# Patient Record
Sex: Female | Born: 1980 | Race: White | Hispanic: No | Marital: Married | Smoking: Never smoker
Health system: Southern US, Community
[De-identification: ages and names within clinical notes are randomized; demographics above are authoritative.]

## PROBLEM LIST (undated history)

## (undated) DIAGNOSIS — O24419 Gestational diabetes mellitus in pregnancy, unspecified control: Secondary | ICD-10-CM

## (undated) DIAGNOSIS — R51 Headache: Secondary | ICD-10-CM

## (undated) DIAGNOSIS — Z8619 Personal history of other infectious and parasitic diseases: Secondary | ICD-10-CM

## (undated) DIAGNOSIS — Z87442 Personal history of urinary calculi: Secondary | ICD-10-CM

## (undated) HISTORY — DX: Personal history of other infectious and parasitic diseases: Z86.19

## (undated) HISTORY — DX: Headache: R51

## (undated) HISTORY — PX: WISDOM TOOTH EXTRACTION: SHX21

## (undated) HISTORY — DX: Personal history of urinary calculi: Z87.442

---

## 2010-11-22 LAB — OB RESULTS CONSOLE GBS: GBS: NEGATIVE

## 2011-05-09 LAB — OB RESULTS CONSOLE HIV ANTIBODY (ROUTINE TESTING): HIV: NONREACTIVE

## 2011-05-09 LAB — OB RESULTS CONSOLE ANTIBODY SCREEN: Antibody Screen: NEGATIVE

## 2011-05-09 LAB — OB RESULTS CONSOLE ABO/RH: RH Type: POSITIVE

## 2011-05-23 LAB — OB RESULTS CONSOLE GC/CHLAMYDIA
Chlamydia: NEGATIVE
Gonorrhea: NEGATIVE

## 2011-08-23 ENCOUNTER — Encounter (HOSPITAL_COMMUNITY): Payer: Self-pay | Admitting: *Deleted

## 2011-08-23 ENCOUNTER — Emergency Department (HOSPITAL_COMMUNITY)
Admission: EM | Admit: 2011-08-23 | Discharge: 2011-08-24 | Disposition: A | Discharge status: Home / Self Care | Payer: No Typology Code available for payment source | Attending: Emergency Medicine | Admitting: Emergency Medicine

## 2011-08-23 DIAGNOSIS — Z3400 Encounter for supervision of normal first pregnancy, unspecified trimester: Secondary | ICD-10-CM | POA: Insufficient documentation

## 2011-08-23 DIAGNOSIS — R109 Unspecified abdominal pain: Secondary | ICD-10-CM | POA: Insufficient documentation

## 2011-08-23 DIAGNOSIS — T1490XA Injury, unspecified, initial encounter: Secondary | ICD-10-CM | POA: Insufficient documentation

## 2011-08-23 DIAGNOSIS — M25519 Pain in unspecified shoulder: Secondary | ICD-10-CM | POA: Insufficient documentation
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
## 2011-08-24 ENCOUNTER — Emergency Department (HOSPITAL_COMMUNITY): Payer: No Typology Code available for payment source

## 2011-08-24 LAB — KLEIHAUER-BETKE STAIN
Fetal Cells %: 0 %
Quantitation Fetal Hemoglobin: 0 mL

## 2011-08-24 NOTE — ED Notes (Signed)
Pt returned from US. No signs of distress.
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
## 2011-08-24 NOTE — ED Notes (Signed)
PT ambulated with a  Steady gait; VSS; A&Ox3; no sigsn of distress; respirations even and unlabored; skin warm and dry; no questions at this time.
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
## 2011-08-24 NOTE — ED Notes (Signed)
Tenderness on left side of where neck meets shoulder.

## 2011-08-24 NOTE — ED Notes (Signed)
Patient transported to Ultrasound 

## 2011-08-25 LAB — ABO/RH: ABO/RH(D): O POS

## 2011-10-16 ENCOUNTER — Encounter: Payer: BC Managed Care – PPO | Attending: Obstetrics and Gynecology | Admitting: Dietician

## 2011-10-16 DIAGNOSIS — Z713 Dietary counseling and surveillance: Secondary | ICD-10-CM | POA: Insufficient documentation

## 2011-10-16 DIAGNOSIS — O99810 Abnormal glucose complicating pregnancy: Secondary | ICD-10-CM | POA: Insufficient documentation

## 2011-10-17 ENCOUNTER — Encounter: Payer: Self-pay | Admitting: Dietician

## 2011-10-17 NOTE — Progress Notes (Signed)
  Patient was seen on 10/16/2011 for Gestational Diabetes self-management class at the [facility]. The following learning objectives were met by the patient during this course:   States the definition of Gestational Diabetes  States why dietary management is important in controlling blood glucose  Describes the effects each nutrient has on blood glucose levels  Demonstrates ability to create a balanced meal plan  Demonstrates carbohydrate counting   States when to check blood glucose levels  Demonstrates proper blood glucose monitoring techniques  States the effect of stress and exercise on blood glucose levels  States the importance of limiting caffeine and abstaining from alcohol and smoking  Blood glucose monitor given: Accu-Chek SmartView Meter Kit (Accu-Chek Smartview strips and Accu-Chek Fastclix lancets) Lot # [id number] Exp: 02/09/2013 Blood glucose reading: 95 at 10:30 AM  Patient instructed to monitor glucose levels:Fasting and 2 hours after the first bite of each meal FBS: 60 - <90 2 hour: <120  Patient received handouts:  Nutrition Diabetes and Pregnancy  Carbohydrate Counting List  Patient will be seen for follow-up as needed.

## 2011-12-18 ENCOUNTER — Inpatient Hospital Stay (HOSPITAL_COMMUNITY): Admission: AD | Admit: 2011-12-18 | Payer: Self-pay | Source: Ambulatory Visit | Admitting: Obstetrics and Gynecology

## 2011-12-19 ENCOUNTER — Encounter (HOSPITAL_COMMUNITY): Payer: Self-pay | Admitting: *Deleted

## 2011-12-19 ENCOUNTER — Telehealth (HOSPITAL_COMMUNITY): Payer: Self-pay | Admitting: *Deleted

## 2011-12-19 NOTE — Telephone Encounter (Signed)
Preadmission screen  

## 2011-12-23 ENCOUNTER — Encounter (HOSPITAL_COMMUNITY): Payer: Self-pay

## 2011-12-23 ENCOUNTER — Inpatient Hospital Stay (HOSPITAL_COMMUNITY)
Admission: RE | Admit: 2011-12-23 | Discharge: 2011-12-27 | DRG: 371 | Disposition: A | Discharge status: Home / Self Care | Payer: BC Managed Care – PPO | Source: Ambulatory Visit | Attending: Obstetrics and Gynecology | Admitting: Obstetrics and Gynecology

## 2011-12-23 DIAGNOSIS — O480 Post-term pregnancy: Principal | ICD-10-CM | POA: Diagnosis present

## 2011-12-23 DIAGNOSIS — O99814 Abnormal glucose complicating childbirth: Secondary | ICD-10-CM | POA: Diagnosis present

## 2011-12-23 HISTORY — DX: Gestational diabetes mellitus in pregnancy, unspecified control: O24.419

## 2011-12-23 LAB — CBC
MCH: 26.9 pg (ref 26.0–34.0)
MCV: 82.2 fL (ref 78.0–100.0)
Platelets: 298 10*3/uL (ref 150–400)
RBC: 4.65 MIL/uL (ref 3.87–5.11)

## 2011-12-23 MED ORDER — LIDOCAINE HCL (PF) 1 % IJ SOLN: 30.0000 mL | INTRAMUSCULAR | Status: DC | PRN

## 2011-12-23 MED ORDER — ONDANSETRON HCL 4 MG/2ML IJ SOLN: 4.0000 mg | Freq: Four times a day (QID) | INTRAMUSCULAR | Status: DC | PRN

## 2011-12-23 MED ORDER — FLEET ENEMA 7-19 GM/118ML RE ENEM: 1.0000 | ENEMA | RECTAL | Status: DC | PRN

## 2011-12-23 MED ORDER — MISOPROSTOL 25 MCG QUARTER TABLET
25.0000 ug | ORAL_TABLET | ORAL | Status: DC | PRN
  Administered 2011-12-23: 25 ug via VAGINAL
  Filled 2011-12-23: qty 0.25

## 2011-12-23 MED ORDER — LACTATED RINGERS IV SOLN: 500.0000 mL | INTRAVENOUS | Status: DC | PRN

## 2011-12-23 MED ORDER — CITRIC ACID-SODIUM CITRATE 334-500 MG/5ML PO SOLN
30.0000 mL | ORAL | Status: DC | PRN
  Administered 2011-12-24: 30 mL via ORAL
  Filled 2011-12-23: qty 15

## 2011-12-23 MED ORDER — OXYCODONE-ACETAMINOPHEN 5-325 MG PO TABS: 1.0000 | ORAL_TABLET | ORAL | Status: DC | PRN

## 2011-12-23 MED ORDER — ACETAMINOPHEN 325 MG PO TABS
650.0000 mg | ORAL_TABLET | ORAL | Status: DC | PRN
  Filled 2011-12-23: qty 2

## 2011-12-23 MED ORDER — IBUPROFEN 600 MG PO TABS: 600.0000 mg | ORAL_TABLET | Freq: Four times a day (QID) | ORAL | Status: DC | PRN

## 2011-12-23 MED ORDER — LACTATED RINGERS IV SOLN
INTRAVENOUS | Status: DC
  Administered 2011-12-23: 125 mL/h via INTRAVENOUS
  Administered 2011-12-24 (×2): via INTRAVENOUS
  Administered 2011-12-24: 125 mL/h via INTRAVENOUS
  Administered 2011-12-24: 11:00:00 via INTRAVENOUS

## 2011-12-23 MED ORDER — OXYTOCIN 40 UNITS IN LACTATED RINGERS INFUSION - SIMPLE MED
62.5000 mL/h | Freq: Once | INTRAVENOUS | Status: DC
  Filled 2011-12-23: qty 1000

## 2011-12-23 MED ORDER — OXYTOCIN BOLUS FROM INFUSION
250.0000 mL | Freq: Once | INTRAVENOUS | Status: DC
  Filled 2011-12-23: qty 500

## 2011-12-23 MED ORDER — TERBUTALINE SULFATE 1 MG/ML IJ SOLN: 0.2500 mg | Freq: Once | INTRAMUSCULAR | Status: AC | PRN

## 2011-12-24 ENCOUNTER — Encounter (HOSPITAL_COMMUNITY)
Admission: RE | Disposition: A | Discharge status: Home / Self Care | Payer: Self-pay | Source: Ambulatory Visit | Attending: Obstetrics and Gynecology

## 2011-12-24 ENCOUNTER — Encounter (HOSPITAL_COMMUNITY): Payer: Self-pay

## 2011-12-24 ENCOUNTER — Inpatient Hospital Stay (HOSPITAL_COMMUNITY): Payer: BC Managed Care – PPO | Admitting: Anesthesiology

## 2011-12-24 ENCOUNTER — Encounter (HOSPITAL_COMMUNITY): Payer: Self-pay | Admitting: Anesthesiology

## 2011-12-24 LAB — GLUCOSE, CAPILLARY: Glucose-Capillary: 81 mg/dL (ref 70–99)

## 2011-12-24 SURGERY — Surgical Case
Anesthesia: Epidural | Site: Abdomen | Wound class: Clean Contaminated

## 2011-12-24 MED ORDER — CEFAZOLIN SODIUM-DEXTROSE 2-3 GM-% IV SOLR
INTRAVENOUS | Status: DC | PRN
  Administered 2011-12-24: 2 g via INTRAVENOUS

## 2011-12-24 MED ORDER — SIMETHICONE 80 MG PO CHEW
80.0000 mg | CHEWABLE_TABLET | Freq: Three times a day (TID) | ORAL | Status: DC
  Administered 2011-12-25 – 2011-12-27 (×6): 80 mg via ORAL

## 2011-12-24 MED ORDER — OXYTOCIN 40 UNITS IN LACTATED RINGERS INFUSION - SIMPLE MED: 62.5000 mL/h | INTRAVENOUS | Status: AC

## 2011-12-24 MED ORDER — PROMETHAZINE HCL 25 MG/ML IJ SOLN: 6.2500 mg | INTRAMUSCULAR | Status: DC | PRN

## 2011-12-24 MED ORDER — LANOLIN HYDROUS EX OINT: 1.0000 "application " | TOPICAL_OINTMENT | CUTANEOUS | Status: DC | PRN

## 2011-12-24 MED ORDER — KETOROLAC TROMETHAMINE 30 MG/ML IJ SOLN
INTRAMUSCULAR | Status: AC
  Administered 2011-12-24: 30 mg via INTRAMUSCULAR
  Filled 2011-12-24: qty 1

## 2011-12-24 MED ORDER — FENTANYL 2.5 MCG/ML BUPIVACAINE 1/10 % EPIDURAL INFUSION (WH - ANES)
INTRAMUSCULAR | Status: DC | PRN
  Administered 2011-12-24: 12 mL/h via EPIDURAL

## 2011-12-24 MED ORDER — MORPHINE SULFATE 0.5 MG/ML IJ SOLN
INTRAMUSCULAR | Status: AC
  Filled 2011-12-24: qty 20

## 2011-12-24 MED ORDER — LACTATED RINGERS IV SOLN: 500.0000 mL | Freq: Once | INTRAVENOUS | Status: DC

## 2011-12-24 MED ORDER — CEFAZOLIN SODIUM-DEXTROSE 2-3 GM-% IV SOLR
INTRAVENOUS | Status: AC
  Filled 2011-12-24: qty 50

## 2011-12-24 MED ORDER — ONDANSETRON HCL 4 MG/2ML IJ SOLN
INTRAMUSCULAR | Status: DC | PRN
  Administered 2011-12-24: 4 mg via INTRAVENOUS

## 2011-12-24 MED ORDER — OXYTOCIN 10 UNIT/ML IJ SOLN
40.0000 [IU] | INTRAVENOUS | Status: DC | PRN
  Administered 2011-12-24: 40 [IU] via INTRAVENOUS

## 2011-12-24 MED ORDER — NALOXONE HCL 0.4 MG/ML IJ SOLN: 0.4000 mg | INTRAMUSCULAR | Status: DC | PRN

## 2011-12-24 MED ORDER — MIDAZOLAM HCL 2 MG/2ML IJ SOLN: 0.5000 mg | Freq: Once | INTRAMUSCULAR | Status: DC | PRN

## 2011-12-24 MED ORDER — LACTATED RINGERS IV SOLN
INTRAVENOUS | Status: DC
  Administered 2011-12-25: 10:00:00 via INTRAVENOUS

## 2011-12-24 MED ORDER — DEXAMETHASONE SODIUM PHOSPHATE 10 MG/ML IJ SOLN
INTRAMUSCULAR | Status: AC
  Filled 2011-12-24: qty 1

## 2011-12-24 MED ORDER — MENTHOL 3 MG MT LOZG: 1.0000 | LOZENGE | OROMUCOSAL | Status: DC | PRN

## 2011-12-24 MED ORDER — PHENYLEPHRINE 40 MCG/ML (10ML) SYRINGE FOR IV PUSH (FOR BLOOD PRESSURE SUPPORT): 80.0000 ug | PREFILLED_SYRINGE | INTRAVENOUS | Status: DC | PRN

## 2011-12-24 MED ORDER — METOCLOPRAMIDE HCL 5 MG/ML IJ SOLN: 10.0000 mg | Freq: Three times a day (TID) | INTRAMUSCULAR | Status: DC | PRN

## 2011-12-24 MED ORDER — METHYLERGONOVINE MALEATE 0.2 MG/ML IJ SOLN
INTRAMUSCULAR | Status: AC
  Filled 2011-12-24: qty 1

## 2011-12-24 MED ORDER — TERBUTALINE SULFATE 1 MG/ML IJ SOLN: 0.2500 mg | Freq: Once | INTRAMUSCULAR | Status: DC | PRN

## 2011-12-24 MED ORDER — OXYCODONE-ACETAMINOPHEN 5-325 MG PO TABS
1.0000 | ORAL_TABLET | ORAL | Status: DC | PRN
Start: 2011-12-24 — End: 2011-12-27
  Administered 2011-12-25 – 2011-12-26 (×6): 1 via ORAL
  Administered 2011-12-26 (×2): 2 via ORAL
  Administered 2011-12-27: 1 via ORAL
  Administered 2011-12-27: 2 via ORAL
  Filled 2011-12-24: qty 1
  Filled 2011-12-24 (×2): qty 2
  Filled 2011-12-24: qty 1
  Filled 2011-12-24: qty 2
  Filled 2011-12-24 (×5): qty 1

## 2011-12-24 MED ORDER — ONDANSETRON HCL 4 MG/2ML IJ SOLN
INTRAMUSCULAR | Status: AC
  Filled 2011-12-24: qty 2

## 2011-12-24 MED ORDER — MEPERIDINE HCL 25 MG/ML IJ SOLN
INTRAMUSCULAR | Status: AC
  Filled 2011-12-24: qty 1

## 2011-12-24 MED ORDER — SODIUM BICARBONATE 8.4 % IV SOLN
INTRAVENOUS | Status: DC | PRN
  Administered 2011-12-24: 4 mL via EPIDURAL

## 2011-12-24 MED ORDER — KETOROLAC TROMETHAMINE 30 MG/ML IJ SOLN: 30.0000 mg | Freq: Four times a day (QID) | INTRAMUSCULAR | Status: AC | PRN

## 2011-12-24 MED ORDER — NALBUPHINE HCL 10 MG/ML IJ SOLN
5.0000 mg | INTRAMUSCULAR | Status: DC | PRN
  Filled 2011-12-24: qty 1

## 2011-12-24 MED ORDER — DIPHENHYDRAMINE HCL 25 MG PO CAPS: 25.0000 mg | ORAL_CAPSULE | ORAL | Status: DC | PRN

## 2011-12-24 MED ORDER — MORPHINE SULFATE (PF) 0.5 MG/ML IJ SOLN
INTRAMUSCULAR | Status: DC | PRN
  Administered 2011-12-24: 3 mg via EPIDURAL

## 2011-12-24 MED ORDER — KETOROLAC TROMETHAMINE 30 MG/ML IJ SOLN
30.0000 mg | Freq: Four times a day (QID) | INTRAMUSCULAR | Status: AC | PRN
  Administered 2011-12-24: 30 mg via INTRAMUSCULAR

## 2011-12-24 MED ORDER — OXYTOCIN 40 UNITS IN LACTATED RINGERS INFUSION - SIMPLE MED
1.0000 m[IU]/min | INTRAVENOUS | Status: DC
  Administered 2011-12-24: 10 m[IU]/min via INTRAVENOUS
  Administered 2011-12-24: 2 m[IU]/min via INTRAVENOUS
  Administered 2011-12-24: 12 m[IU]/min via INTRAVENOUS
  Filled 2011-12-24: qty 1000

## 2011-12-24 MED ORDER — DIPHENHYDRAMINE HCL 50 MG/ML IJ SOLN: 12.5000 mg | INTRAMUSCULAR | Status: DC | PRN

## 2011-12-24 MED ORDER — ACETAMINOPHEN 10 MG/ML IV SOLN
1000.0000 mg | Freq: Four times a day (QID) | INTRAVENOUS | Status: AC | PRN
  Filled 2011-12-24: qty 100

## 2011-12-24 MED ORDER — TETANUS-DIPHTH-ACELL PERTUSSIS 5-2.5-18.5 LF-MCG/0.5 IM SUSP: 0.5000 mL | Freq: Once | INTRAMUSCULAR | Status: DC

## 2011-12-24 MED ORDER — OXYTOCIN 10 UNIT/ML IJ SOLN
INTRAMUSCULAR | Status: AC
  Filled 2011-12-24: qty 4

## 2011-12-24 MED ORDER — SCOPOLAMINE 1 MG/3DAYS TD PT72
1.0000 | MEDICATED_PATCH | Freq: Once | TRANSDERMAL | Status: DC
  Administered 2011-12-24: 1.5 mg via TRANSDERMAL

## 2011-12-24 MED ORDER — PHENYLEPHRINE 40 MCG/ML (10ML) SYRINGE FOR IV PUSH (FOR BLOOD PRESSURE SUPPORT)
80.0000 ug | PREFILLED_SYRINGE | INTRAVENOUS | Status: DC | PRN
  Filled 2011-12-24: qty 5

## 2011-12-24 MED ORDER — WITCH HAZEL-GLYCERIN EX PADS: 1.0000 "application " | MEDICATED_PAD | CUTANEOUS | Status: DC | PRN

## 2011-12-24 MED ORDER — LACTATED RINGERS IV SOLN
INTRAVENOUS | Status: DC | PRN
  Administered 2011-12-24 (×3): via INTRAVENOUS

## 2011-12-24 MED ORDER — METHYLERGONOVINE MALEATE 0.2 MG/ML IJ SOLN
INTRAMUSCULAR | Status: DC | PRN
  Administered 2011-12-24: 0.2 mg via INTRAMUSCULAR

## 2011-12-24 MED ORDER — SCOPOLAMINE 1 MG/3DAYS TD PT72
MEDICATED_PATCH | TRANSDERMAL | Status: AC
  Administered 2011-12-24: 1.5 mg via TRANSDERMAL
  Filled 2011-12-24: qty 1

## 2011-12-24 MED ORDER — IBUPROFEN 600 MG PO TABS
600.0000 mg | ORAL_TABLET | Freq: Four times a day (QID) | ORAL | Status: DC
  Administered 2011-12-25 – 2011-12-27 (×10): 600 mg via ORAL
  Filled 2011-12-24 (×10): qty 1

## 2011-12-24 MED ORDER — SODIUM CHLORIDE 0.9 % IJ SOLN: 3.0000 mL | INTRAMUSCULAR | Status: DC | PRN

## 2011-12-24 MED ORDER — ONDANSETRON HCL 4 MG PO TABS: 4.0000 mg | ORAL_TABLET | ORAL | Status: DC | PRN

## 2011-12-24 MED ORDER — MEPERIDINE HCL 25 MG/ML IJ SOLN: 6.2500 mg | INTRAMUSCULAR | Status: DC | PRN

## 2011-12-24 MED ORDER — ONDANSETRON HCL 4 MG/2ML IJ SOLN: 4.0000 mg | INTRAMUSCULAR | Status: DC | PRN

## 2011-12-24 MED ORDER — FENTANYL CITRATE 0.05 MG/ML IJ SOLN: 25.0000 ug | INTRAMUSCULAR | Status: DC | PRN

## 2011-12-24 MED ORDER — EPHEDRINE 5 MG/ML INJ
10.0000 mg | INTRAVENOUS | Status: DC | PRN
  Filled 2011-12-24: qty 4

## 2011-12-24 MED ORDER — SIMETHICONE 80 MG PO CHEW: 80.0000 mg | CHEWABLE_TABLET | ORAL | Status: DC | PRN

## 2011-12-24 MED ORDER — DEXAMETHASONE SODIUM PHOSPHATE 4 MG/ML IJ SOLN
INTRAMUSCULAR | Status: DC | PRN
  Administered 2011-12-24: 10 mg via INTRAVENOUS

## 2011-12-24 MED ORDER — MEPERIDINE HCL 25 MG/ML IJ SOLN
INTRAMUSCULAR | Status: DC | PRN
  Administered 2011-12-24 (×2): 12.5 mg via INTRAVENOUS

## 2011-12-24 MED ORDER — SODIUM CHLORIDE 0.9 % IV SOLN
1.0000 ug/kg/h | INTRAVENOUS | Status: DC | PRN
  Filled 2011-12-24: qty 2.5

## 2011-12-24 MED ORDER — ONDANSETRON HCL 4 MG/2ML IJ SOLN: 4.0000 mg | Freq: Three times a day (TID) | INTRAMUSCULAR | Status: DC | PRN

## 2011-12-24 MED ORDER — DIBUCAINE 1 % RE OINT: 1.0000 "application " | TOPICAL_OINTMENT | RECTAL | Status: DC | PRN

## 2011-12-24 MED ORDER — DIPHENHYDRAMINE HCL 50 MG/ML IJ SOLN: 25.0000 mg | INTRAMUSCULAR | Status: DC | PRN

## 2011-12-24 MED ORDER — FENTANYL 2.5 MCG/ML BUPIVACAINE 1/10 % EPIDURAL INFUSION (WH - ANES)
14.0000 mL/h | INTRAMUSCULAR | Status: DC
  Administered 2011-12-24: 14 mL/h via EPIDURAL
  Filled 2011-12-24 (×2): qty 60

## 2011-12-24 MED ORDER — EPHEDRINE 5 MG/ML INJ: 10.0000 mg | INTRAVENOUS | Status: DC | PRN

## 2011-12-24 MED ORDER — DIPHENHYDRAMINE HCL 25 MG PO CAPS: 25.0000 mg | ORAL_CAPSULE | Freq: Four times a day (QID) | ORAL | Status: DC | PRN

## 2011-12-24 MED ORDER — ZOLPIDEM TARTRATE 5 MG PO TABS: 5.0000 mg | ORAL_TABLET | Freq: Every evening | ORAL | Status: DC | PRN

## 2011-12-24 MED ORDER — PRENATAL MULTIVITAMIN CH
1.0000 | ORAL_TABLET | Freq: Every day | ORAL | Status: DC
  Administered 2011-12-25 – 2011-12-27 (×3): 1 via ORAL
  Filled 2011-12-24 (×3): qty 1

## 2011-12-24 MED ORDER — SENNOSIDES-DOCUSATE SODIUM 8.6-50 MG PO TABS
2.0000 | ORAL_TABLET | Freq: Every day | ORAL | Status: DC
  Administered 2011-12-25 – 2011-12-26 (×2): 2 via ORAL

## 2011-12-24 SURGICAL SUPPLY — 29 items
CHLORAPREP W/TINT 26ML (MISCELLANEOUS) ×2 IMPLANT
CLOTH BEACON ORANGE TIMEOUT ST (SAFETY) ×2 IMPLANT
DERMABOND ADVANCED (GAUZE/BANDAGES/DRESSINGS)
DERMABOND ADVANCED .7 DNX12 (GAUZE/BANDAGES/DRESSINGS) IMPLANT
ELECT REM PT RETURN 9FT ADLT (ELECTROSURGICAL) ×2
ELECTRODE REM PT RTRN 9FT ADLT (ELECTROSURGICAL) ×1 IMPLANT
EXTRACTOR VACUUM M CUP 4 TUBE (SUCTIONS) IMPLANT
GLOVE BIO SURGEON STRL SZ 6 (GLOVE) ×2 IMPLANT
GLOVE BIOGEL PI IND STRL 6 (GLOVE) ×2 IMPLANT
GLOVE BIOGEL PI INDICATOR 6 (GLOVE) ×2
GOWN PREVENTION PLUS LG XLONG (DISPOSABLE) ×6 IMPLANT
GOWN STRL REIN XL XLG (GOWN DISPOSABLE) ×2 IMPLANT
KIT ABG SYR 3ML LUER SLIP (SYRINGE) ×2 IMPLANT
NEEDLE HYPO 25X5/8 SAFETYGLIDE (NEEDLE) IMPLANT
NS IRRIG 1000ML POUR BTL (IV SOLUTION) ×2 IMPLANT
PACK C SECTION WH (CUSTOM PROCEDURE TRAY) ×2 IMPLANT
PAD ABD 7.5X8 STRL (GAUZE/BANDAGES/DRESSINGS) ×2 IMPLANT
SLEEVE SCD COMPRESS KNEE MED (MISCELLANEOUS) ×2 IMPLANT
STAPLER VISISTAT 35W (STAPLE) IMPLANT
SUT CHROMIC 0 CT 802H (SUTURE) IMPLANT
SUT CHROMIC 0 CTX 36 (SUTURE) ×6 IMPLANT
SUT MON AB-0 CT1 36 (SUTURE) ×2 IMPLANT
SUT PDS AB 0 CTX 60 (SUTURE) ×2 IMPLANT
SUT PLAIN 0 NONE (SUTURE) IMPLANT
SUT VIC AB 4-0 KS 27 (SUTURE) IMPLANT
TAPE CLOTH SURG 4X10 WHT LF (GAUZE/BANDAGES/DRESSINGS) ×2 IMPLANT
TOWEL OR 17X24 6PK STRL BLUE (TOWEL DISPOSABLE) ×4 IMPLANT
TRAY FOLEY CATH 14FR (SET/KITS/TRAYS/PACK) IMPLANT
WATER STERILE IRR 1000ML POUR (IV SOLUTION) IMPLANT

## 2011-12-24 NOTE — Transfer of Care (Signed)
Immediate Anesthesia Transfer of Care Note  Patient: Diana Allen  Procedure(s) Performed: Procedure(s) (LRB): CESAREAN SECTION (N/A)  Patient Location: PACU  Anesthesia Type: Epidural  Level of Consciousness: awake  Airway & Oxygen Therapy: Patient Spontanous Breathing  Post-op Assessment: Report given to PACU RN and Post -op Vital signs reviewed and stable  Post vital signs: stable  Complications: No apparent anesthesia complications

## 2011-12-24 NOTE — Progress Notes (Signed)
Feeling CTX more than previously.  Does not desire pain control at this time. VSS.   CTX q 1-3 min, pit 10 FHT Cat I.   SVE: FB removed.  4/75/-2, AROM, clear 31yo G1 at [redacted]w[redacted]d for postdates IOL IOL-Continue to increase pitocin.  Will place IUPC next check if no change. FHT reassuring. A1DM-CBG q 4hrs.

## 2011-12-24 NOTE — Progress Notes (Signed)
Dr. Henderson Cloud notified of pt status, FHR, UC pattern, SVE, and RN unable to place misoprostol. Will continue to monitor.

## 2011-12-24 NOTE — Progress Notes (Signed)
C/O rare crampy abdominal discomfort.  VSS.  Cat I tracing.  CTX >3/10 min. SVE: 1/50/-2.  FB placed with 60cc without complication and well tolerated. Traction tape applied. Plan to start pitocin 2 up 2.  Mitchel Honour, DO

## 2011-12-24 NOTE — Op Note (Addendum)
Diana Allen PROCEDURE DATE: 12/24/2011  PREOPERATIVE DIAGNOSIS: Intrauterine pregnancy at [redacted]w[redacted]d weeks gestation, A1DM, arrest of dilation.  POSTOPERATIVE DIAGNOSIS: The same  PROCEDURE:    Low Transverse Cesarean Section  SURGEON:  Dr. Mitchel Honour   INDICATIONS: Diana Allen is a 31 y.o. G1 at [redacted]w[redacted]d for cesarean section secondary to arrest of dilation at 4cm.  She was admitted for post-dates induction approximately 24 hours prior to her delivery.  She initially received cytotec x 1 dose followed by foley bulb and pitocin.  Her cervix was unchanged after the foley bulb was removed despite >3 hours of adequacy. The risks of cesarean section discussed with the patient included but were not limited to: bleeding which may require transfusion or reoperation; infection which may require antibiotics; injury to bowel, bladder, ureters or other surrounding organs; injury to the fetus; need for additional procedures including hysterectomy in the event of a life-threatening hemorrhage; placental abnormalities wth subsequent pregnancies, incisional problems, thromboembolic phenomenon and other postoperative/anesthesia complications. The patient concurred with the proposed plan, giving informed written consent for the procedure.    FINDINGS:  Viable female infant in cephalic presentation, APGAR 5,9: weight 9#1 clear amniotic fluid.  Intact placenta, three vessel cord.  Grossly normal uterus, ovaries and fallopian tubes. .   ANESTHESIA:    Epidural ESTIMATED BLOOD LOSS: 800 ml SPECIMENS: none COMPLICATIONS: none  PROCEDURE IN DETAIL:  The patient received intravenous antibiotics and had sequential compression devices applied to her lower extremities while in the preoperative area.  She was then taken to the operating room where her epidural  anesthesia was dosed up to surgical level and was found to be adequate. She was then placed in a dorsal supine position with a leftward tilt, and prepped and draped  in a sterile manner.  A foley catheter was placed into her bladder and attached to constant gravity.  After an adequate timeout was performed, a Pfannenstiel skin incision was made with scalpel and carried through to the underlying layer of fascia. The fascia was incised in the midline and this incision was extended bilaterally using the Mayo scissors. Kocher clamps were applied to the superior aspect of the fascial incision and the underlying rectus muscles were dissected off bluntly. A similar process was carried out on the inferior aspect of the facial incision. The rectus muscles were separated in the midline bluntly and the peritoneum was entered bluntly.   A transverse hysterotomy was made with a scalpel and extended bilaterally bluntly. The bladder blade was then removed. The infant was successfully delivered with assistance of a Kiwi vacuum x 1 pull, and cord was clamped and cut and infant was handed over to awaiting neonatology team. Uterine massage was then administered and the placenta delivered intact with three-vessel cord. The uterus was cleared of clot and debris.  Uterine atony resolved with methergine x 1 dose.  The hysterotomy was closed with #1 chromic.  A second imbricating suture of #1 chromic was used to reinforce the incision and aid in hemostasis.  The peritoneum and rectus muscles were noted to be hemostatic.  A 0-Vicryl stitch was placed in a running fashion to close the peritoneum and reapproximate the rectus muscles. The fascia was closed with double looped PDS in a running fashion with good restoration of anatomy.  The subcutaneus tissue was copiously irrigated.  The skin was closed using 0-Vicryl in a subcuticular fashion.  Dermabond was applied.  Pt tolerated the procedure will.  All counts were correct x2.  Pt went  to the recovery room in stable condition.

## 2011-12-24 NOTE — Anesthesia Preprocedure Evaluation (Signed)
Anesthesia Evaluation    Airway       Dental   Pulmonary asthma ,          Cardiovascular     Neuro/Psych    GI/Hepatic   Endo/Other    Renal/GU      Musculoskeletal   Abdominal   Peds  Hematology   Anesthesia Other Findings   Reproductive/Obstetrics                           Anesthesia Physical Anesthesia Plan  ASA: II  Anesthesia Plan: Epidural   Post-op Pain Management:    Induction:   Airway Management Planned:   Additional Equipment:   Intra-op Plan:   Post-operative Plan:   Informed Consent: I have reviewed the patients History and Physical, chart, labs and discussed the procedure including the risks, benefits and alternatives for the proposed anesthesia with the patient or authorized representative who has indicated his/her understanding and acceptance.   Dental Advisory Given  Plan Discussed with:   Anesthesia Plan Comments: (Labs checked- platelets confirmed with RN in room. Fetal heart tracing, per RN, reported to be stable enough for sitting procedure. Discussed epidural, and patient consents to the procedure:  included risk of possible headache,backache, failed block, allergic reaction, and nerve injury. This patient was asked if she had any questions or concerns before the procedure started. )        Anesthesia Quick Evaluation  

## 2011-12-24 NOTE — Anesthesia Procedure Notes (Signed)

## 2011-12-24 NOTE — Anesthesia Postprocedure Evaluation (Signed)
Anesthesia Post Note  Patient: Diana Allen  Procedure(s) Performed: Procedure(s) (LRB): CESAREAN SECTION (N/A)  Anesthesia type: Epidural  Patient location: PACU  Post pain: Pain level controlled  Post assessment: Post-op Vital signs reviewed  Last Vitals:  Filed Vitals:   12/24/11 2230  BP:   Pulse: 88  Temp:   Resp: 16    Post vital signs: Reviewed  Level of consciousness: awake  Complications: No apparent anesthesia complications

## 2011-12-24 NOTE — H&P (Signed)
Diana Allen is a 31 y.o. female presenting for postdates induction. Maternal Medical History:  Contractions: Frequency: irregular.   Perceived severity is mild.    Fetal activity: Perceived fetal activity is normal.   Last perceived fetal movement was within the past hour.    Prenatal complications: no prenatal complications Prenatal Complications - Diabetes: gestational. Diabetes is managed by diet.      OB History    Grav Para Term Preterm Abortions TAB SAB Ect Mult Living   1              Past Medical History  Diagnosis Date  . Asthma   . H/O varicella   . Headache     migraines  . Personal history of kidney stones   . Gestational diabetes    Past Surgical History  Procedure Date  . Wisdom tooth extraction    Family History: family history includes Anxiety disorder in her father and mother; Cancer in her maternal grandmother and paternal aunt; Depression in her father and mother; Diabetes in her cousin and paternal grandmother; Heart disease in her maternal grandfather and maternal grandmother; Hypertension in her father; and Kidney disease in her sister. Social History:  reports that she has never smoked. She has never used smokeless tobacco. She reports that she does not drink alcohol or use illicit drugs.   Prenatal Transfer Tool  Maternal Diabetes: Yes:  Diabetes Type:  Diet controlled Genetic Screening: Normal Maternal Ultrasounds/Referrals: Normal Fetal Ultrasounds or other Referrals:  None Maternal Substance Abuse:  No Significant Maternal Medications:  None Significant Maternal Lab Results:  None Other Comments:  None  ROS  Dilation: 1 Effacement (%): 50 Station: -2 Exam by:: Dr. Langston Masker Blood pressure 125/68, pulse 68, temperature 98.3 F (36.8 C), temperature source Oral, resp. rate 18, height 5' (1.524 m), weight 71.668 kg (158 lb), last menstrual period 03/13/2011. Maternal Exam:  Uterine Assessment: Contraction strength is mild.  Contraction  frequency is regular.   Abdomen: Patient reports no abdominal tenderness. Fundal height is c/w dates.   Estimated fetal weight is 8#.   Fetal presentation: vertex  Introitus: Normal vulva. Pelvis: adequate for delivery.   Cervix: Cervix evaluated by digital exam.     Physical Exam  Nursing note and vitals reviewed. Constitutional: She appears well-developed and well-nourished.  GI: Soft.    Prenatal labs: ABO, Rh: --/--/O POS (04/13 1610) Antibody: Negative (12/27 0000) Rubella: Immune (12/27 0000) RPR:    HBsAg: Negative (12/27 0000)  HIV: Non-reactive (12/27 0000)  GBS: Negative (07/12 0000)   Assessment/Plan: 31yo G1 @ [redacted]w[redacted]d for postdates induction. A1DM with excellent glycemic control this pregnancy.   2 stage induction.  VMP then foley bulb and pitocin.   Diana Allen 12/24/2011, 8:19 AM

## 2011-12-24 NOTE — Progress Notes (Signed)
Comfortable with epidural. VSS.   Toco q 2 min, pit 12 FHT Cat I SVE 4/75/-2, caput.  IUPC placed. 31yo G1 at [redacted]w[redacted]d for postdates IOL -FHT reassuring -Pitocin to adequate MVUs -Reassess in 2 hours.

## 2011-12-25 ENCOUNTER — Encounter (HOSPITAL_COMMUNITY): Payer: Self-pay | Admitting: Obstetrics & Gynecology

## 2011-12-25 LAB — CBC
Hemoglobin: 11.6 g/dL — ABNORMAL LOW (ref 12.0–15.0)
MCH: 26.9 pg (ref 26.0–34.0)
MCHC: 33 g/dL (ref 30.0–36.0)
MCV: 81.7 fL (ref 78.0–100.0)
RBC: 4.31 MIL/uL (ref 3.87–5.11)

## 2011-12-25 LAB — GLUCOSE, CAPILLARY
Glucose-Capillary: 128 mg/dL — ABNORMAL HIGH (ref 70–99)
Glucose-Capillary: 108 mg/dL — ABNORMAL HIGH (ref 70–99)

## 2011-12-25 LAB — RPR: RPR Ser Ql: NONREACTIVE

## 2011-12-25 NOTE — Addendum Note (Signed)
Addendum  created 12/25/11 1432 by Shanon Payor, CRNA   Modules edited:Notes Section

## 2011-12-25 NOTE — Anesthesia Postprocedure Evaluation (Signed)
  Anesthesia Post-op Note  Patient: Diana Allen  Procedure(s) Performed: Procedure(s) (LRB): CESAREAN SECTION (N/A)  Patient Location: Mother/Baby  Anesthesia Type: Epidural  Level of Consciousness: awake, alert  and oriented  Airway and Oxygen Therapy: Patient Spontanous Breathing  Post-op Pain: none  Post-op Assessment: Post-op Vital signs reviewed, Patient's Cardiovascular Status Stable, No headache, No backache, No residual numbness and No residual motor weakness  Post-op Vital Signs: Reviewed and stable  Complications: No apparent anesthesia complications

## 2011-12-25 NOTE — Progress Notes (Signed)
Subjective: Postpartum Day 1: Cesarean Delivery Patient reports tolerating PO.    Objective: Vital signs in last 24 hours: Temp:  [97.3 F (36.3 C)-99.2 F (37.3 C)] 98.3 F (36.8 C) (08/14 0435) Pulse Rate:  [58-88] 66  (08/14 0435) Resp:  [16-20] 16  (08/14 0435) BP: (113-151)/(54-98) 125/80 mmHg (08/14 0435) SpO2:  [95 %-100 %] 95 % (08/14 0435)  Physical Exam:  General: alert and cooperative Lochia: appropriate Uterine Fundus: firm Incision: healing well DVT Evaluation: No evidence of DVT seen on physical exam. Foley with concentrated urine BS+   Basename 12/25/11 0545 12/23/11 1952  HGB 11.6* 12.5  HCT 35.2* 38.2    Assessment/Plan: Status post Cesarean section. Doing well postoperatively.  Continue current care CBC in am.  Diana Allen G 12/25/2011, 7:55 AM

## 2011-12-26 LAB — CBC WITH DIFFERENTIAL/PLATELET
Eosinophils Relative: 1 % (ref 0–5)
HCT: 28.2 % — ABNORMAL LOW (ref 36.0–46.0)
Hemoglobin: 9.3 g/dL — ABNORMAL LOW (ref 12.0–15.0)
Lymphocytes Relative: 13 % (ref 12–46)
Lymphs Abs: 2.4 10*3/uL (ref 0.7–4.0)
MCV: 82.2 fL (ref 78.0–100.0)
Monocytes Absolute: 1.1 10*3/uL — ABNORMAL HIGH (ref 0.1–1.0)
Monocytes Relative: 6 % (ref 3–12)
RBC: 3.43 MIL/uL — ABNORMAL LOW (ref 3.87–5.11)
WBC: 19.1 10*3/uL — ABNORMAL HIGH (ref 4.0–10.5)

## 2011-12-26 LAB — GLUCOSE, CAPILLARY: Glucose-Capillary: 106 mg/dL — ABNORMAL HIGH (ref 70–99)

## 2011-12-26 NOTE — Progress Notes (Signed)
Subjective: Postpartum Day 2: Cesarean Delivery Patient reports incisional pain, tolerating PO, + flatus and no problems voiding.    Objective: Vital signs in last 24 hours: Temp:  [97.5 F (36.4 C)-98.3 F (36.8 C)] 98.3 F (36.8 C) (08/15 0607) Pulse Rate:  [67-111] 111  (08/15 0607) Resp:  [18-20] 18  (08/15 0607) BP: (89-104)/(52-65) 89/57 mmHg (08/15 0607) SpO2:  [91 %-97 %] 97 % (08/14 1331)  Physical Exam:  General: alert and cooperative Lochia: appropriate Uterine Fundus: firm Incision: healing well DVT Evaluation: No evidence of DVT seen on physical exam.   Basename 12/26/11 0450 12/25/11 0545  HGB 9.3* 11.6*  HCT 28.2* 35.2*    Assessment/Plan: Status post Cesarean section. Doing well postoperatively.  Continue current care Dc blood sugars.  Dyneshia Baccam G 12/26/2011, 8:21 AM

## 2011-12-27 MED ORDER — OXYCODONE-ACETAMINOPHEN 5-325 MG PO TABS: 1.0000 | ORAL_TABLET | ORAL | Status: AC | PRN

## 2011-12-27 MED ORDER — AMOXICILLIN-POT CLAVULANATE 875-125 MG PO TABS: 1.0000 | ORAL_TABLET | Freq: Two times a day (BID) | ORAL | Status: AC

## 2011-12-27 MED ORDER — AMOXICILLIN-POT CLAVULANATE 875-125 MG PO TABS
1.0000 | ORAL_TABLET | Freq: Two times a day (BID) | ORAL | Status: DC
  Filled 2011-12-27: qty 1

## 2011-12-27 MED ORDER — IBUPROFEN 600 MG PO TABS: 600.0000 mg | ORAL_TABLET | Freq: Four times a day (QID) | ORAL | Status: AC

## 2011-12-27 NOTE — Discharge Summary (Signed)
Obstetric Discharge Summary Reason for Admission: induction of labor Prenatal Procedures: ultrasound Intrapartum Procedures: cesarean: low cervical, transverse Postpartum Procedures: none Complications-Operative and Postpartum: early cellutlitis Hemoglobin  Date Value Range Status  12/26/2011 9.3* 12.0 - 15.0 g/dL Final     REPEATED TO VERIFY     DELTA CHECK NOTED     HCT  Date Value Range Status  12/26/2011 28.2* 36.0 - 46.0 % Final    Physical Exam:  General: alert and cooperative Lochia: appropriate Uterine Fundus: firm, tender Incision: healing well, with erythema superior to incisional site DVT Evaluation: No evidence of DVT seen on physical exam.  Discharge Diagnoses: Term Pregnancy-delivered  Discharge Information: Date: 12/27/2011 Activity: pelvic rest Diet: routine Medications: PNV, Ibuprofen, Percocet and augmentin Condition: stable Instructions: refer to practice specific booklet Discharge to: home   Newborn Data: Live born female  Birth Weight: 9 lb 1.9 oz (4135 g) APGAR: 4, 9  Home with mother.  Diana Allen G 12/27/2011, 8:26 AM
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
## 2014-03-14 ENCOUNTER — Encounter (HOSPITAL_COMMUNITY): Payer: Self-pay | Admitting: Obstetrics & Gynecology

## 2017-02-03 ENCOUNTER — Other Ambulatory Visit (HOSPITAL_COMMUNITY): Payer: Self-pay | Admitting: Obstetrics and Gynecology

## 2017-02-03 DIAGNOSIS — Z3141 Encounter for fertility testing: Secondary | ICD-10-CM

## 2017-02-05 ENCOUNTER — Ambulatory Visit (HOSPITAL_COMMUNITY)
Admission: RE | Admit: 2017-02-05 | Discharge: 2017-02-05 | Disposition: A | Discharge status: Home / Self Care | Payer: BLUE CROSS/BLUE SHIELD | Source: Ambulatory Visit | Attending: Obstetrics and Gynecology | Admitting: Obstetrics and Gynecology

## 2017-02-05 DIAGNOSIS — Z3141 Encounter for fertility testing: Secondary | ICD-10-CM

## 2017-02-05 DIAGNOSIS — N979 Female infertility, unspecified: Secondary | ICD-10-CM | POA: Insufficient documentation

## 2017-02-05 MED ORDER — IOPAMIDOL (ISOVUE-300) INJECTION 61%
30.0000 mL | Freq: Once | INTRAVENOUS | Status: AC | PRN
  Administered 2017-02-05: 20 mL

## 2021-04-30 ENCOUNTER — Other Ambulatory Visit: Payer: Self-pay | Admitting: Obstetrics and Gynecology

## 2021-04-30 DIAGNOSIS — N6452 Nipple discharge: Secondary | ICD-10-CM

## 2021-06-01 ENCOUNTER — Ambulatory Visit
Admission: RE | Admit: 2021-06-01 | Discharge: 2021-06-01 | Disposition: A | Discharge status: Home / Self Care | Payer: BLUE CROSS/BLUE SHIELD | Source: Ambulatory Visit | Attending: Obstetrics and Gynecology | Admitting: Obstetrics and Gynecology

## 2021-06-01 ENCOUNTER — Ambulatory Visit
Admission: RE | Admit: 2021-06-01 | Discharge: 2021-06-01 | Disposition: A | Discharge status: Home / Self Care | Payer: BC Managed Care – PPO | Source: Ambulatory Visit | Attending: Obstetrics and Gynecology | Admitting: Obstetrics and Gynecology

## 2021-06-01 ENCOUNTER — Other Ambulatory Visit: Payer: Self-pay

## 2021-06-01 DIAGNOSIS — N6452 Nipple discharge: Secondary | ICD-10-CM

## 2021-06-08 ENCOUNTER — Other Ambulatory Visit: Payer: Self-pay | Admitting: Obstetrics and Gynecology

## 2021-06-08 DIAGNOSIS — N6452 Nipple discharge: Secondary | ICD-10-CM

## 2021-07-08 ENCOUNTER — Other Ambulatory Visit: Payer: BC Managed Care – PPO

## 2021-12-14 ENCOUNTER — Ambulatory Visit (INDEPENDENT_AMBULATORY_CARE_PROVIDER_SITE_OTHER): Payer: BC Managed Care – PPO | Admitting: Podiatry

## 2021-12-14 ENCOUNTER — Encounter: Payer: Self-pay | Admitting: Podiatry

## 2021-12-14 DIAGNOSIS — L600 Ingrowing nail: Secondary | ICD-10-CM | POA: Diagnosis not present

## 2021-12-14 NOTE — Progress Notes (Signed)
  Subjective:  Patient ID: Diana Allen, female    DOB: [birth year],   MRN: [id number]  Chief Complaint  Patient presents with   Ingrown Toenail     left great toenail ingrown/ started antibiotics     41 y.o. female presents for concern of left great ingrown toenail that has been present for several days . Relates she has been on antibiotics for a couple days given by urgent care and told to follow-up with [place]. Relates she picks at the toe. She has been soaking and relates the toe has improved on the antibiotics. .  . Denies any other pedal complaints. Denies n/v/f/c.   Past Medical History:  Diagnosis Date   Asthma    Gestational diabetes    H/O varicella    Headache(784.0)    migraines   Personal history of kidney stones     Objective:  Physical Exam: Vascular: DP/PT pulses 2/4 bilateral. CFT <3 seconds. Normal hair growth on digits. No edema.  Skin. No lacerations or abrasions bilateral feet. Left hallux lateral border incurvated with erythema edema but no purulence noted.  Musculoskeletal: MMT 5/5 bilateral lower extremities in DF, PF, Inversion and Eversion. Deceased ROM in DF of ankle joint.  Neurological: Sensation intact to light touch.   Assessment:   1. Ingrown nail of great toe of left foot      Plan:  Patient was evaluated and treated and all questions answered. Patient requesting removal of ingrown nail today. Procedure below.  Discussed procedure and post procedure care and patient expressed understanding.  Finish out antibiotic course.  Will follow-up in 2 weeks for nail check or sooner if any problems arise.    Procedure:  Procedure: partial Nail Avulsion of left hallux lateral nail border.  Surgeon: Louann Sjogren, DPM  Pre-op Dx: Ingrown toenail with infection Post-op: Same  Place of Surgery: Office exam room.  Indications for surgery: Painful and ingrown toenail.    The patient is requesting removal of nail with chemical matrixectomy. Risks  and complications were discussed with the patient for which they understand and written consent was obtained. Under sterile conditions a total of 3 mL of  1% lidocaine plain was infiltrated in a hallux block fashion. Once anesthetized, the skin was prepped in sterile fashion. A tourniquet was then applied. Next the lateral aspect of hallux nail border was then sharply excised making sure to remove the entire offending nail border.  Next phenol was then applied under standard conditions and copiously irrigated. Silvadene was applied. A dry sterile dressing was applied. After application of the dressing the tourniquet was removed and there is found to be an immediate capillary refill time to the digit. The patient tolerated the procedure well without any complications. Post procedure instructions were discussed the patient for which he verbally understood. Follow-up in two weeks for nail check or sooner if any problems are to arise. Discussed signs/symptoms of infection and directed to call the office immediately should any occur or go directly to the emergency room. In the meantime, encouraged to call the office with any questions, concerns, changes symptoms.   Louann Sjogren, DPM

## 2021-12-14 NOTE — Patient Instructions (Signed)

## 2022-01-02 ENCOUNTER — Encounter: Payer: Self-pay | Admitting: Podiatrist

## 2022-01-02 ENCOUNTER — Ambulatory Visit (INDEPENDENT_AMBULATORY_CARE_PROVIDER_SITE_OTHER): Payer: BC Managed Care – PPO | Admitting: Podiatrist

## 2022-01-02 DIAGNOSIS — Z9889 Other specified postprocedural states: Secondary | ICD-10-CM

## 2022-01-02 NOTE — Progress Notes (Signed)
Chief Complaint  Patient presents with   Ingrown Toenail    Patient came in today for a ingrown nail removal follow-up of the left hallux today, she is doing well, no drainage or swelling, patient has finished antibiotics.      HPI: Patient is 41 y.o. female who presents today for follow up of ingrown toenail removal left great toenail. She is doing well. No complaints.    Allergies  Allergen Reactions   Nitrofurantoin Monohyd Macro Hives and Itching    Also, gives allergy to strawberries    Review of systems is negative except as noted in the HPI.  Denies nausea/ vomiting/ fevers/ chills or night sweats.   Denies difficulty breathing, denies calf pain or tenderness  Physical Exam  Neurovascular status intact and unchanged.  Left hallux nail is healing well. No redness, swelling or signs of infection. She relates the toenail feels good and relates no pain.    Assessment:   ICD-10-CM   1. Status post nail surgery  Z98.890        Plan: Discussed she may discontinue soaks.  She may start to leave the toe open to the air at night- cover during the day. If any concerns arise she will call.

## 2022-05-14 ENCOUNTER — Ambulatory Visit: Payer: BC Managed Care – PPO | Admitting: Podiatry

## 2023-10-09 IMAGING — MG DIGITAL DIAGNOSTIC BILAT W/ TOMO W/ CAD
6 of 10 series · 6 of 30 positions shown · non-contrast
Comparison: Previous exam(s).
COMPARISON: Previous exam(s).

Addendum:
CLINICAL DATA: 40-year-old female with approximately 2 weeks of
spontaneous clear/yellow right nipple discharge which is resolved
for about 1 month.

EXAM:
DIGITAL DIAGNOSTIC BILATERAL MAMMOGRAM WITH TOMOSYNTHESIS AND CAD;
ULTRASOUND RIGHT BREAST LIMITED
TECHNIQUE: Bilateral digital diagnostic mammography and breast tomosynthesis
was performed. The images were evaluated with computer-aided
detection.; Targeted ultrasound examination of the right breast was
performed

[L MLO synth-2D]
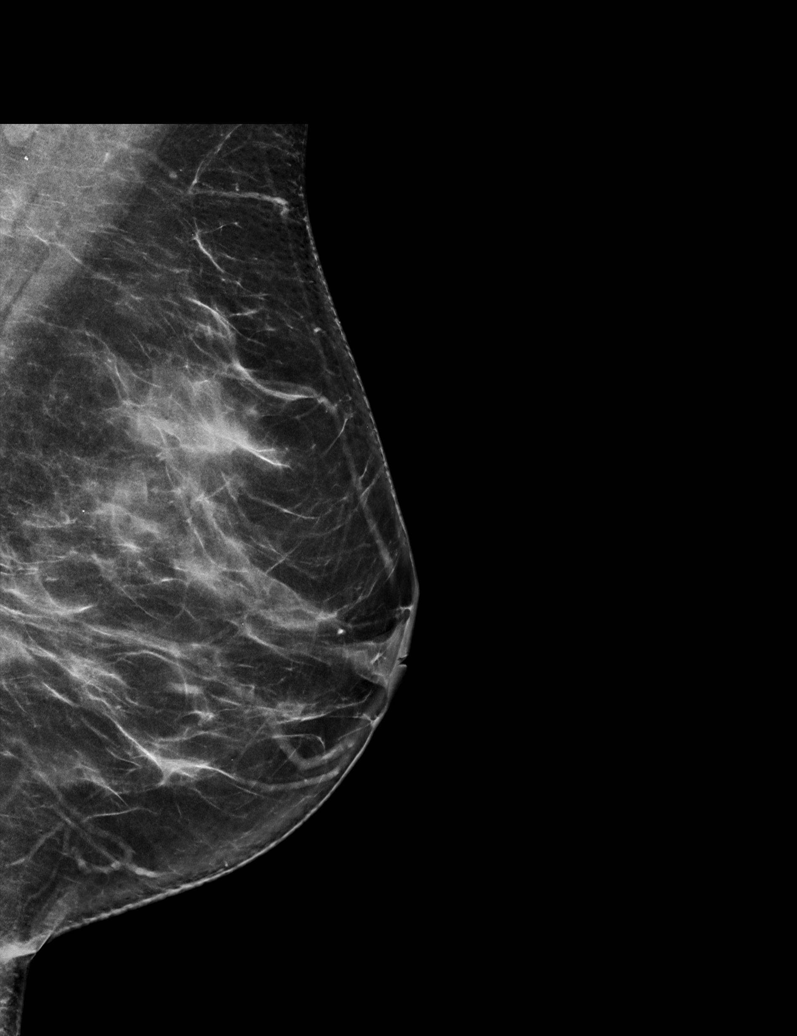

[R MLO synth-2D (1 of 2)]
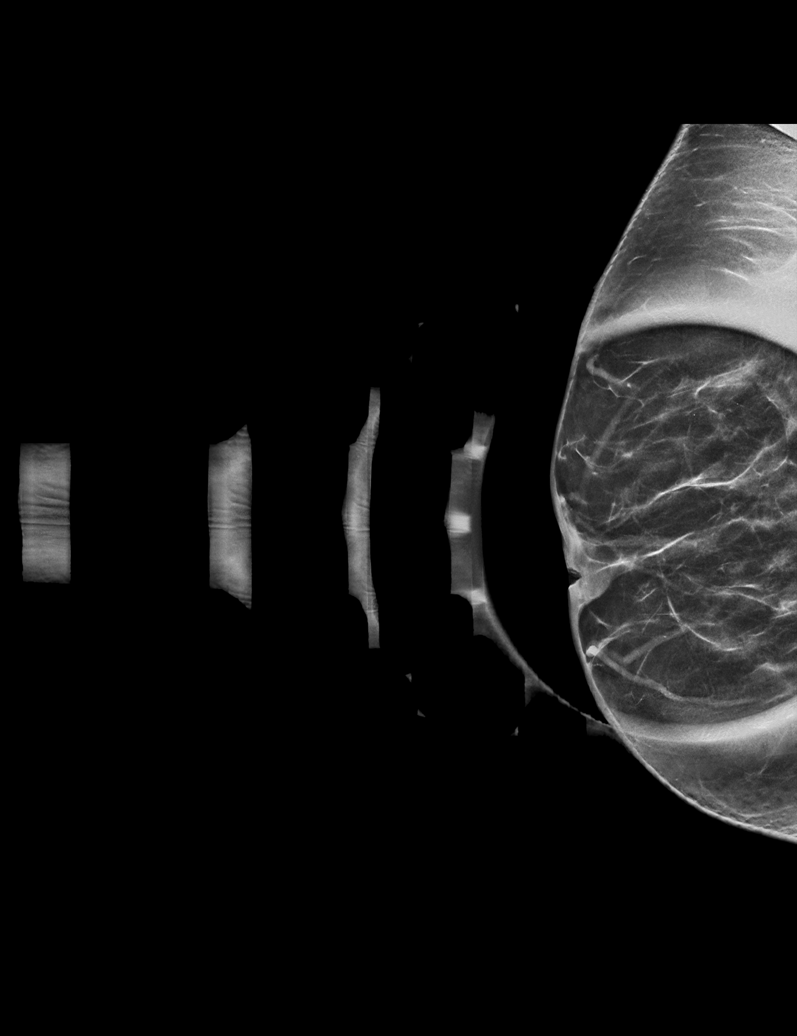

[L CC synth-2D]
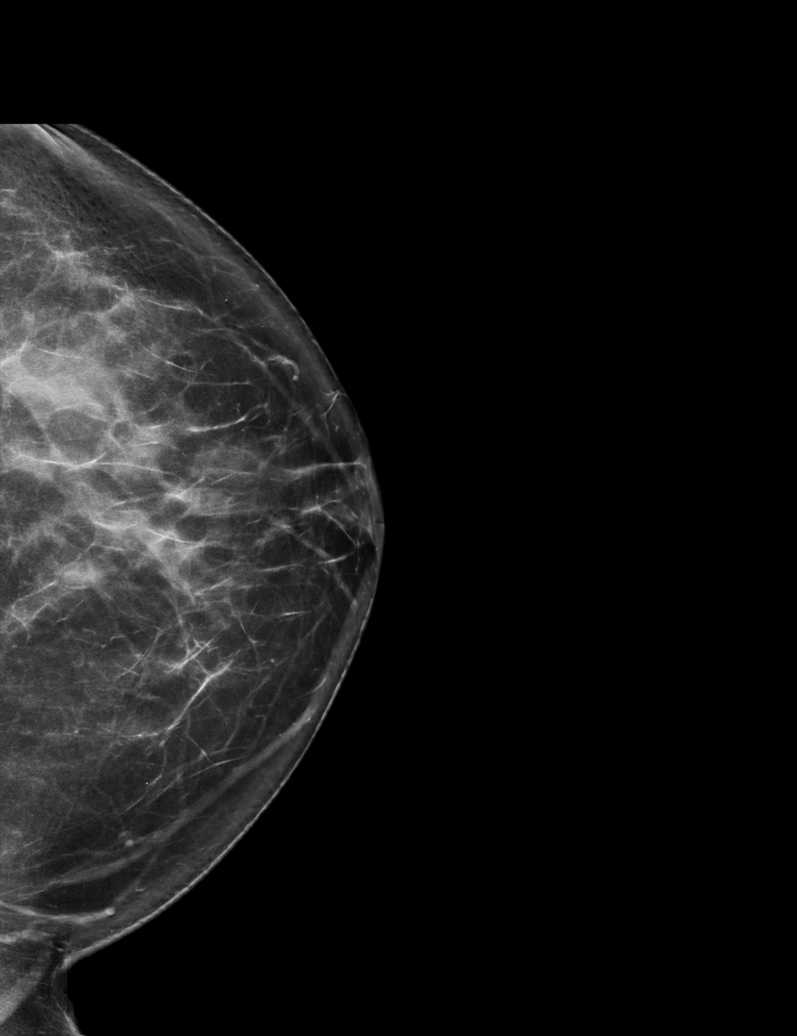

[R CC synth-2D]
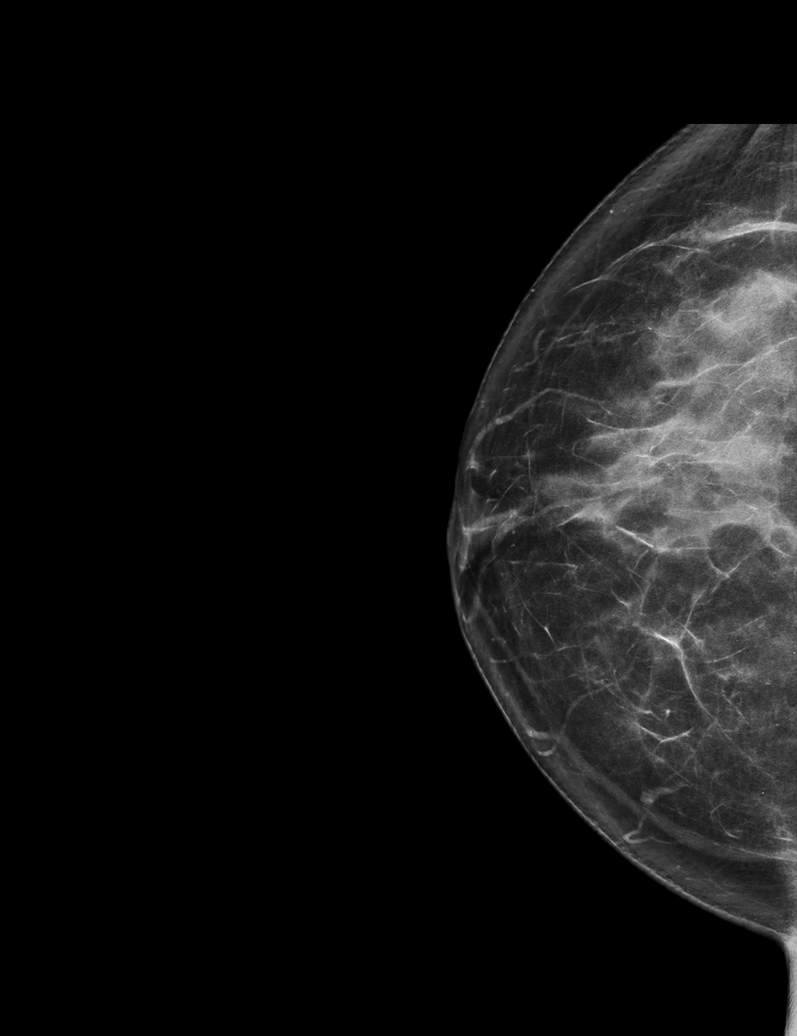

[R MLO synth-2D (2 of 2)]
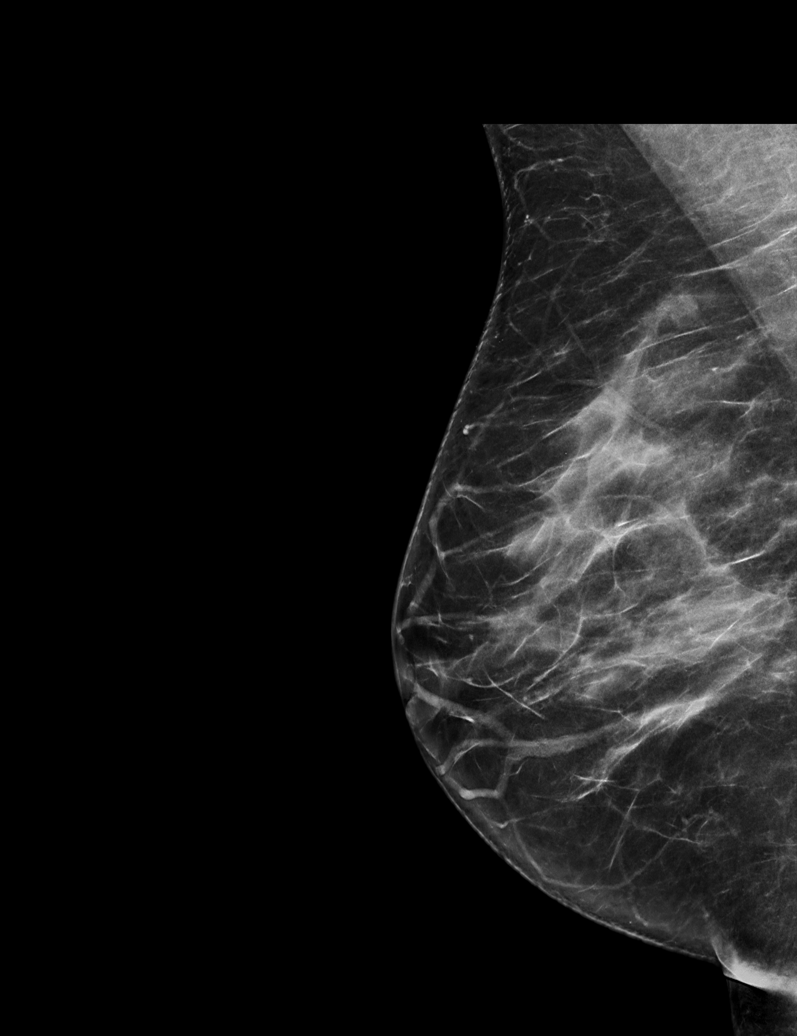

[R MLO tomo · tomo slice 35/70.0]
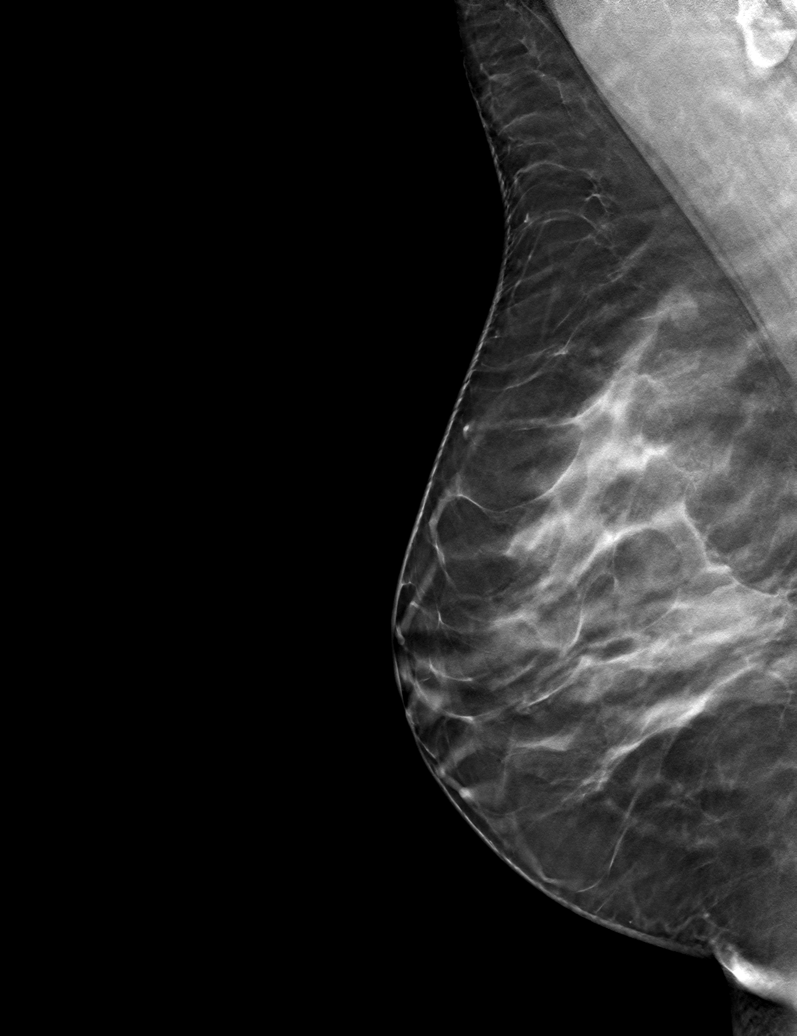

[6 of 30 positions shown; findings below may reference images not displayed]

ACR Breast Density Category c: The breast tissue is heterogeneously
dense, which may obscure small masses.
FINDINGS: No focal or suspicious mammographic findings are identified in
either breast. The parenchymal pattern is stable.

Targeted ultrasound is performed, showing a single, minimally
dilated duct along the 10 o'clock retroareolar position of the right
breast. No intraductal masses are identified. No additional
suspicious findings in the subareolar right breast.
IMPRESSION: 1. No mammographic evidence of malignancy in either breast.
2. Single, minimally dilated duct in the retroareolar right breast.
No intraductal masses identified.

RECOMMENDATION:
Given the patient's history of prior unilateral nipple discharge and
a minimally dilated duct, recommendation is for contrast enhanced
MRI evaluation and surgical consultation. If MRI results are
negative, clinical and symptomatic follow-up is recommended.

I have discussed the findings and recommendations with the patient.
If applicable, a reminder letter will be sent to the patient
regarding the next appointment.

BI-RADS CATEGORY  2: Benign.

ADDENDUM:
Surgical consultation has been arranged with Dr. Gabriel Ignacio Mena Gonzalez at
[REDACTED] on June 15, 2021.

Edna Luis Fracis RN on 06/04/2021

*** End of Addendum ***
ACR Breast Density Category c: The breast tissue is heterogeneously
dense, which may obscure small masses.
FINDINGS: No focal or suspicious mammographic findings are identified in
either breast. The parenchymal pattern is stable.

Targeted ultrasound is performed, showing a single, minimally
dilated duct along the 10 o'clock retroareolar position of the right
breast. No intraductal masses are identified. No additional
suspicious findings in the subareolar right breast.
IMPRESSION: 1. No mammographic evidence of malignancy in either breast.
2. Single, minimally dilated duct in the retroareolar right breast.
No intraductal masses identified.

RECOMMENDATION:
Given the patient's history of prior unilateral nipple discharge and
a minimally dilated duct, recommendation is for contrast enhanced
MRI evaluation and surgical consultation. If MRI results are
negative, clinical and symptomatic follow-up is recommended.

I have discussed the findings and recommendations with the patient.
If applicable, a reminder letter will be sent to the patient
regarding the next appointment.

BI-RADS CATEGORY  2: Benign.

## 2023-10-09 IMAGING — US US BREAST*R* LIMITED INC AXILLA
1 series · 2 of 2 positions shown · non-contrast
Comparison: Previous exam(s).
COMPARISON: Previous exam(s).

Addendum:
CLINICAL DATA: 40-year-old female with approximately 2 weeks of
spontaneous clear/yellow right nipple discharge which is resolved
for about 1 month.

EXAM:
DIGITAL DIAGNOSTIC BILATERAL MAMMOGRAM WITH TOMOSYNTHESIS AND CAD;
ULTRASOUND RIGHT BREAST LIMITED
TECHNIQUE: Bilateral digital diagnostic mammography and breast tomosynthesis
was performed. The images were evaluated with computer-aided
detection.; Targeted ultrasound examination of the right breast was
performed

[Series 1: us breast*right* limited inc axilla · 0.07mm/px · 2 of 2 slices shown]
[im 1/2]
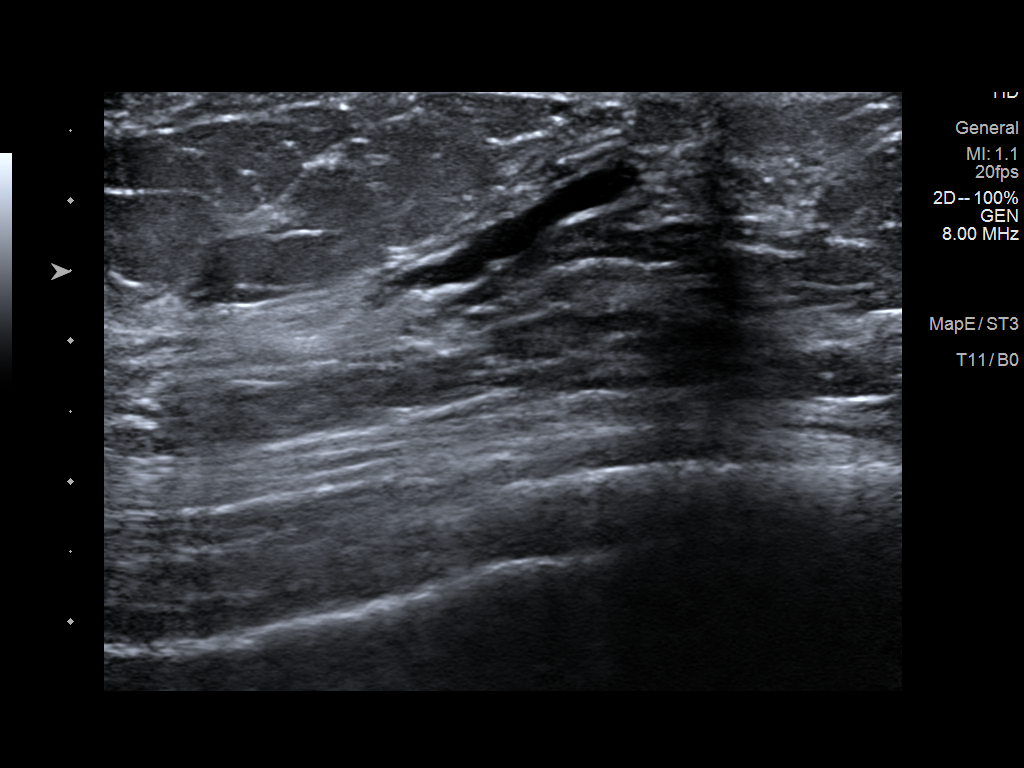
[im 2/2]
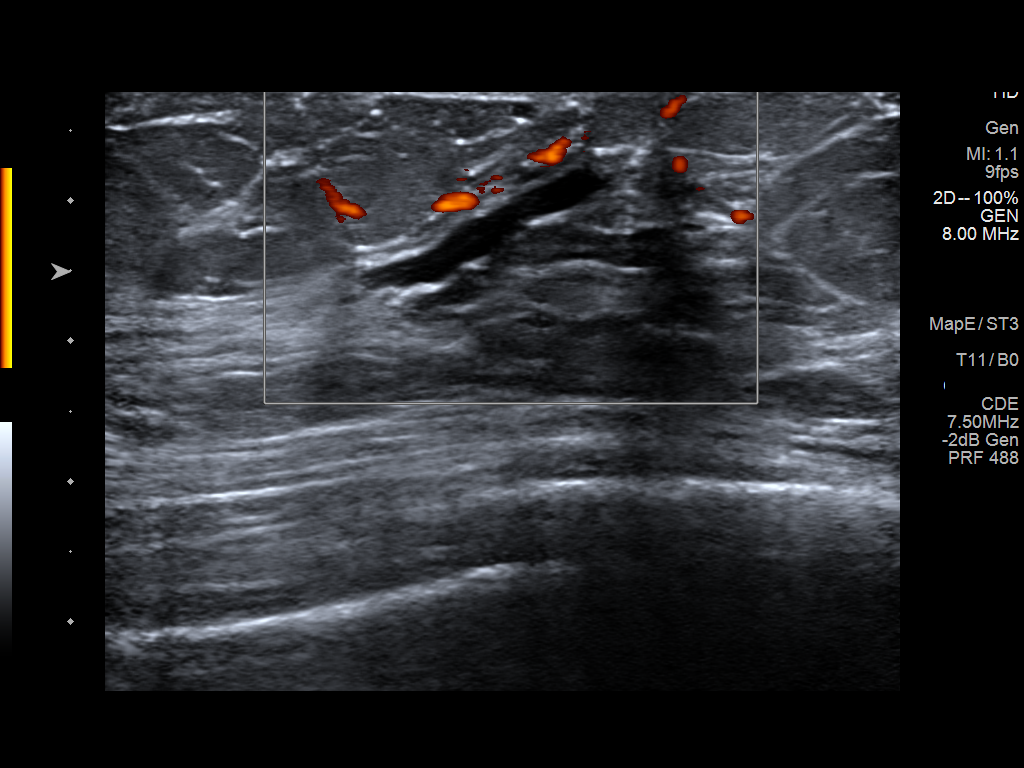

[2 of 2 positions shown; findings below may reference images not displayed]

ACR Breast Density Category c: The breast tissue is heterogeneously
dense, which may obscure small masses.
FINDINGS: No focal or suspicious mammographic findings are identified in
either breast. The parenchymal pattern is stable.

Targeted ultrasound is performed, showing a single, minimally
dilated duct along the 10 o'clock retroareolar position of the right
breast. No intraductal masses are identified. No additional
suspicious findings in the subareolar right breast.
IMPRESSION: 1. No mammographic evidence of malignancy in either breast.
2. Single, minimally dilated duct in the retroareolar right breast.
No intraductal masses identified.

RECOMMENDATION:
Given the patient's history of prior unilateral nipple discharge and
a minimally dilated duct, recommendation is for contrast enhanced
MRI evaluation and surgical consultation. If MRI results are
negative, clinical and symptomatic follow-up is recommended.

I have discussed the findings and recommendations with the patient.
If applicable, a reminder letter will be sent to the patient
regarding the next appointment.

BI-RADS CATEGORY  2: Benign.

ADDENDUM:
Surgical consultation has been arranged with Dr. Gabriel Ignacio Mena Gonzalez at
[REDACTED] on June 15, 2021.

Edna Luis Fracis RN on 06/04/2021

*** End of Addendum ***
ACR Breast Density Category c: The breast tissue is heterogeneously
dense, which may obscure small masses.
FINDINGS: No focal or suspicious mammographic findings are identified in
either breast. The parenchymal pattern is stable.

Targeted ultrasound is performed, showing a single, minimally
dilated duct along the 10 o'clock retroareolar position of the right
breast. No intraductal masses are identified. No additional
suspicious findings in the subareolar right breast.
IMPRESSION: 1. No mammographic evidence of malignancy in either breast.
2. Single, minimally dilated duct in the retroareolar right breast.
No intraductal masses identified.

RECOMMENDATION:
Given the patient's history of prior unilateral nipple discharge and
a minimally dilated duct, recommendation is for contrast enhanced
MRI evaluation and surgical consultation. If MRI results are
negative, clinical and symptomatic follow-up is recommended.

I have discussed the findings and recommendations with the patient.
If applicable, a reminder letter will be sent to the patient
regarding the next appointment.

BI-RADS CATEGORY  2: Benign.

## 2023-11-09 ENCOUNTER — Ambulatory Visit (HOSPITAL_BASED_OUTPATIENT_CLINIC_OR_DEPARTMENT_OTHER)
Admission: EM | Admit: 2023-11-09 | Discharge: 2023-11-09 | Disposition: A | Discharge status: Home / Self Care | Attending: Family Medicine | Admitting: Family Medicine

## 2023-11-09 ENCOUNTER — Encounter (HOSPITAL_BASED_OUTPATIENT_CLINIC_OR_DEPARTMENT_OTHER): Payer: Self-pay | Admitting: Emergency Medicine

## 2023-11-09 DIAGNOSIS — R319 Hematuria, unspecified: Secondary | ICD-10-CM | POA: Insufficient documentation

## 2023-11-09 DIAGNOSIS — N390 Urinary tract infection, site not specified: Secondary | ICD-10-CM | POA: Insufficient documentation

## 2023-11-09 DIAGNOSIS — R350 Frequency of micturition: Secondary | ICD-10-CM | POA: Diagnosis present

## 2023-11-09 DIAGNOSIS — R300 Dysuria: Secondary | ICD-10-CM | POA: Diagnosis not present

## 2023-11-09 LAB — POCT URINALYSIS DIP (MANUAL ENTRY)
Bilirubin, UA: NEGATIVE
Glucose, UA: NEGATIVE mg/dL
Ketones, POC UA: NEGATIVE mg/dL
Nitrite, UA: NEGATIVE
Protein Ur, POC: NEGATIVE mg/dL
Spec Grav, UA: 1.01 (ref 1.010–1.025)
Urobilinogen, UA: 0.2 U/dL
pH, UA: 5.5 (ref 5.0–8.0)

## 2023-11-09 MED ORDER — CEPHALEXIN 500 MG PO CAPS: 500.0000 mg | ORAL_CAPSULE | Freq: Three times a day (TID) | ORAL | 0 refills | Status: AC

## 2023-11-09 NOTE — Discharge Instructions (Signed)
 UTI with hematuria: UA is showing white blood cells and red blood cells.  Does not clearly showing infection.  Urine culture sent.  Will treat with antibiotics for now.  If culture is negative needs to stop the antibiotic and see urology for further workup of hematuria.  Cephalexin 500 mg 1 pill 3 times a day for 7 days for probable UTI.  Get plenty of fluids.  If blood in the urine persists, the patient needs workup with urology for hematuria.

## 2023-11-09 NOTE — ED Provider Notes (Signed)
 [facility]    CSN: [id number] Arrival date & time: 11/09/23  1434      History   Chief Complaint No chief complaint on file.   HPI Diana Allen is a 43 y.o. female.   Patient reports burning and frequency of urination with foul odor to urine since the morning of 11/08/2023.  She push fluids all day and felt like things were getting better.  Early this morning she voided and there was some blood on the toilet tissue.  The patient is not a smoker.  No family history of kidney nor bladder cancer.  She reports that she had cystoscopy 15 or 20 years ago and she thinks it might have been for blood in her urine at that time.  Everything was normal when she had the cystoscopy.  She denies flank pain and has no history of kidney stones.     Past Medical History:  Diagnosis Date   Asthma    Gestational diabetes    H/O varicella    Headache(784.0)    migraines   Personal history of kidney stones     There are no active problems to display for this patient.   Past Surgical History:  Procedure Laterality Date   CESAREAN SECTION  12/24/2011   Procedure: CESAREAN SECTION;  Surgeon: Duwaine Blumenthal, DO;  Location: [facility];  Service: Gynecology;  Laterality: N/A;   WISDOM TOOTH EXTRACTION      OB History     Gravida  1   Para  1   Term  1   Preterm  0   AB  0   Living  1      SAB  0   IAB  0   Ectopic  0   Multiple  0   Live Births  1            Home Medications    Prior to Admission medications   Medication Sig Start Date End Date Taking? Authorizing Provider  cephALEXin (KEFLEX) 500 MG capsule Take 1 capsule (500 mg total) by mouth 3 (three) times daily for 7 days. 11/09/23 11/16/23 Yes Ival Domino, FNP  fexofenadine (ALLEGRA) 180 MG tablet Take 180 mg by mouth.   Yes [provider]  acetaminophen  (TYLENOL ) 500 MG tablet Take 1,000 mg by mouth every 6 (six) hours as needed. For pain    [provider]  Prenatal Vit-Fe  Fumarate-FA (PRENATAL MULTIVITAMIN) TABS Take 1 tablet by mouth at bedtime.    [provider]    Family History Family History  Problem Relation Age of Onset   Depression Mother    Anxiety disorder Mother    Hypertension Father    Depression Father    Anxiety disorder Father    Kidney disease Sister        born with end stage renal disease   Cancer Paternal Aunt        colon   Heart disease Maternal Grandmother    Cancer Maternal Grandmother        uterine   Heart disease Maternal Grandfather    Diabetes Paternal Grandmother    Diabetes Cousin    Breast cancer Neg Hx     Social History Social History   Tobacco Use   Smoking status: Never   Smokeless tobacco: Never  Substance Use Topics   Alcohol use: No   Drug use: No     Allergies   Nitrofurantoin monohyd macro and Amoxicillin    Review of  Systems Review of Systems  Constitutional:  Negative for fever.  Respiratory:  Negative for cough.   Cardiovascular:  Negative for chest pain.  Gastrointestinal:  Negative for abdominal pain, constipation, diarrhea, nausea and vomiting.  Genitourinary:  Positive for dysuria, frequency, hematuria and urgency.  Musculoskeletal:  Negative for arthralgias and back pain.  Skin:  Negative for color change and rash.  Neurological:  Negative for syncope.  All other systems reviewed and are negative.    Physical Exam Triage Vital Signs ED Triage Vitals  Encounter Vitals Group     BP 11/09/23 1546 127/84     Girls Systolic BP Percentile --      Girls Diastolic BP Percentile --      Boys Systolic BP Percentile --      Boys Diastolic BP Percentile --      Pulse Rate 11/09/23 1546 62     Resp 11/09/23 1546 18     Temp 11/09/23 1546 98.4 F (36.9 C)     Temp Source 11/09/23 1546 Oral     SpO2 11/09/23 1546 98 %     Weight --      Height --      Head Circumference --      Peak Flow --      Pain Score 11/09/23 1544 3     Pain Loc --      Pain Education --       Exclude from Growth Chart --    No data found.  Updated Vital Signs BP 127/84 (BP Location: Right Arm)   Pulse 62   Temp 98.4 F (36.9 C) (Oral)   Resp 18   LMP 05/27/2021 (Approximate)   SpO2 98%   Visual Acuity Right Eye Distance:   Left Eye Distance:   Bilateral Distance:    Right Eye Near:   Left Eye Near:    Bilateral Near:     Physical Exam Vitals and nursing note reviewed.  Constitutional:      General: She is not in acute distress.    Appearance: She is well-developed. She is not ill-appearing or toxic-appearing.  HENT:     Head: Normocephalic and atraumatic.     Right Ear: External ear normal.     Left Ear: External ear normal.     Nose: Nose normal.     Mouth/Throat:     Lips: Pink.     Mouth: Mucous membranes are moist.   Eyes:     Conjunctiva/sclera: Conjunctivae normal.     Pupils: Pupils are equal, round, and reactive to light.    Cardiovascular:     Rate and Rhythm: Normal rate and regular rhythm.     Heart sounds: S1 normal and S2 normal. No murmur heard. Pulmonary:     Effort: Pulmonary effort is normal. No respiratory distress.     Breath sounds: Normal breath sounds. No decreased breath sounds, wheezing, rhonchi or rales.  Abdominal:     General: Bowel sounds are normal.     Palpations: Abdomen is soft.     Tenderness: There is abdominal tenderness (Mild) in the right lower quadrant, suprapubic area and left lower quadrant. There is no right CVA tenderness, left CVA tenderness, guarding or rebound. Negative signs include Murphy's sign, Rovsing's sign and McBurney's sign.   Musculoskeletal:        General: No swelling.   Skin:    General: Skin is warm and dry.     Capillary Refill: Capillary refill takes less than 2  seconds.     Findings: No rash.   Neurological:     Mental Status: She is alert and oriented to person, place, and time.   Psychiatric:        Mood and Affect: Mood normal.      UC Treatments / Results  Labs (all  labs ordered are listed, but only abnormal results are displayed) Labs Reviewed  POCT URINALYSIS DIP (MANUAL ENTRY) - Abnormal; Notable for the following components:      Result Value   Blood, UA large (*)    Leukocytes, UA Small (1+) (*)    All other components within normal limits  URINE CULTURE    EKG   Radiology No results found.  Procedures Procedures (including critical care time)  Medications Ordered in UC Medications - No data to display  Initial Impression / Assessment and Plan / UC Course  I have reviewed the triage vital signs and the nursing notes.  Pertinent labs & imaging results that were available during my care of the patient were reviewed by me and considered in my medical decision making (see chart for details).  Plan of Care: Dysuria vs. UTI with Hematuria:  UA is slightly abnormal but not clearly showing an acute UTI.  Urine culture sent.  Patient is having workup for allergies that are possibly amoxicillin  or nitrofurantoin.  She thinks she has tolerated cephalexin in the past.  Cephalexin 500 mg 1 pill 3 times a day for 7 days.  If culture is negative she will be called and told to stop the antibiotic.  Advised that she may need to get a workup for hematuria.  Provided information about Germain Brothers, MD, a urologist here in [place].  Follow-up with Dr. Mayo or here as needed.  I reviewed the plan of care with the patient and/or the patient's guardian.  The patient and/or guardian had time to ask questions and acknowledged that the questions were answered.  I provided instruction on symptoms or reasons to return here or to go to an ER, if symptoms/condition did not improve, worsened or if new symptoms occurred.  Final Clinical Impressions(s) / UC Diagnoses   Final diagnoses:  Dysuria  Urinary tract infection with hematuria, site unspecified     Discharge Instructions      UTI with hematuria: UA is showing white blood cells and red blood cells.   Does not clearly showing infection.  Urine culture sent.  Will treat with antibiotics for now.  If culture is negative needs to stop the antibiotic and see urology for further workup of hematuria.  Cephalexin 500 mg 1 pill 3 times a day for 7 days for probable UTI.  Get plenty of fluids.  If blood in the urine persists, the patient needs workup with urology for hematuria.     ED Prescriptions     Medication Sig Dispense Auth. Provider   cephALEXin (KEFLEX) 500 MG capsule Take 1 capsule (500 mg total) by mouth 3 (three) times daily for 7 days. 21 capsule Ival Domino, FNP      PDMP not reviewed this encounter.   Ival Domino, FNP 11/09/23 1626

## 2023-11-09 NOTE — ED Triage Notes (Signed)
 Pt c/o blood in urine, right flank pain and foul smelling urine x 2 days

## 2023-11-11 ENCOUNTER — Ambulatory Visit (HOSPITAL_COMMUNITY): Payer: Self-pay

## 2023-11-11 LAB — URINE CULTURE: Culture: 20000 — AB

## 2023-11-11 MED ORDER — SULFAMETHOXAZOLE-TRIMETHOPRIM 800-160 MG PO TABS: 1.0000 | ORAL_TABLET | Freq: Two times a day (BID) | ORAL | 0 refills | Status: AC

## 2023-11-11 MED ORDER — SULFAMETHOXAZOLE-TRIMETHOPRIM 800-160 MG PO TABS: 1.0000 | ORAL_TABLET | Freq: Two times a day (BID) | ORAL | 0 refills | Status: DC

## 2023-12-11 ENCOUNTER — Ambulatory Visit (INDEPENDENT_AMBULATORY_CARE_PROVIDER_SITE_OTHER): Admitting: Allergy and Immunology

## 2023-12-11 ENCOUNTER — Encounter: Payer: Self-pay | Admitting: Allergy and Immunology

## 2023-12-11 ENCOUNTER — Other Ambulatory Visit: Payer: Self-pay | Admitting: Allergy and Immunology

## 2023-12-11 VITALS — BP 118/76 | HR 65 | Resp 16 | Ht 59.0 in | Wt 173.8 lb

## 2023-12-11 DIAGNOSIS — L989 Disorder of the skin and subcutaneous tissue, unspecified: Secondary | ICD-10-CM

## 2023-12-11 DIAGNOSIS — L308 Other specified dermatitis: Secondary | ICD-10-CM | POA: Diagnosis not present

## 2023-12-11 DIAGNOSIS — J4530 Mild persistent asthma, uncomplicated: Secondary | ICD-10-CM

## 2023-12-11 DIAGNOSIS — R197 Diarrhea, unspecified: Secondary | ICD-10-CM

## 2023-12-11 DIAGNOSIS — L503 Dermatographic urticaria: Secondary | ICD-10-CM

## 2023-12-11 MED ORDER — AIRSUPRA 90-80 MCG/ACT IN AERO: 1.0000 | INHALATION_SPRAY | Freq: Four times a day (QID) | RESPIRATORY_TRACT | 2 refills | Status: AC | PRN

## 2023-12-11 MED ORDER — ASMANEX (30 METERED DOSES) 220 MCG/ACT IN AEPB: 1.0000 | INHALATION_SPRAY | Freq: Every day | RESPIRATORY_TRACT | 5 refills | Status: AC

## 2023-12-11 NOTE — Patient Instructions (Addendum)
  1. Allegra / fexofenadine 180 - 1-2 tablets 1-2 times per day (MAX=4/day)  2. Asmanex  220 twisthaler - 1 inhalation 1 time per day (empty lungs)  3. Airsupra  - 2 inhalations every 4-6 hours if needed (replaces albuterol)  4. Can continue topical clobetasol if needed  5. Review results of chest x-ray  6. Check diarrhea for Gastrointestinal Profile, Stool, PCR, Test Number [id number]  7. Check blood for tryptase, alpha-gal panel, thyroid peroxidase antibody  8. Return for skin testing without antihistamine use

## 2023-12-11 NOTE — Progress Notes (Signed)
 [place] - [place] - [place]   Dear Lamarr Riding,  Thank you for referring Diana Allen to the [facility] [place]  on 12/11/2023.   Below is a summation of this patient's evaluation and recommendations.  Thank you for your referral. I will keep you informed about this patient's response to treatment.   If you have any questions please do not hesitate to contact me.   Sincerely,  Camellia DOROTHA Denis, MD Allergy / Immunology [facility]    ______________________________________________________________________    NEW PATIENT NOTE  Referring Provider: Riding Lamarr BRAVO, NP Primary Provider: Riding Lamarr BRAVO, NP Date of office visit: 12/11/2023    Subjective:   Chief Complaint:  Diana Allen (DOB: [birth year]) is a 43 y.o. female who presents to the clinic on 12/11/2023 with a chief complaint of Pruritis and Rash .     HPI: Diana Allen presents to this clinic in evaluation of of itchiness.  Apparently in April 2023 she started to develop this patchy itchiness involving multiple areas of her body including her vaginal area, right hip, small of her back, center of her anterior chest, for which she saw [facility] dermatology and was treated with various topical creams with a waxing and waning pattern for several years.  Clobetasol did appear to result in resolution of this red bumpy dermatitis.  She received a biopsy on her right hip and apparently the dermatitis identified drug reaction.  In January 2025 her pattern changed significantly.  She apparently had a sinus infection that was treated with amoxicillin .  Ever since then she has been having diffuse pruritus with dermatographia and her right hip lesion and the lesion on her anterior chest also appears to flare somewhat.  She was treated with 90 days of Diflucan which did not help her.  She was given Allegra twice a  day and that actually did help with some of her pruritus.  She has also had a problem for the past 5 years with intermittent diarrhea occurring at least 1 time per week with very watery yellow stool.  And she has also developed recrudescence of her childhood asthma manifested as coughing and wheezing since January 2025 requiring her to use the short acting bronchodilator 1 or 2 times per day.  Apparently she had a chest x-ray in May 2025 which was normal.  Past Medical History:  Diagnosis Date   Asthma    Gestational diabetes    H/O varicella    Headache(784.0)    migraines   Personal history of kidney stones     Past Surgical History:  Procedure Laterality Date   CESAREAN SECTION  12/24/2011   Procedure: CESAREAN SECTION;  Surgeon: Duwaine Blumenthal, DO;  Location: [facility];  Service: Gynecology;  Laterality: N/A;   WISDOM TOOTH EXTRACTION      Allergies as of 12/11/2023       Reactions   Nitrofurantoin Monohyd Macro Hives, Itching   Also, gives allergy to strawberries   Amoxicillin  Rash        Medication List    acetaminophen  500 MG tablet Commonly known as: TYLENOL  Take 1,000 mg by mouth every 6 (six) hours as needed. For pain   albuterol 108 (90 Base) MCG/ACT inhaler Commonly known as: VENTOLIN HFA INHALE 2 PUFFS FOUR TIMES DAILY FOR WHEEZING   fexofenadine 180 MG tablet Commonly known as: ALLEGRA Take 180 mg by mouth.   prenatal multivitamin Tabs  tablet Take 1 tablet by mouth at bedtime.     Review of systems negative except as noted in HPI / PMHx or noted below:  Review of Systems  Constitutional: Negative.   HENT: Negative.    Eyes: Negative.   Respiratory: Negative.    Cardiovascular: Negative.   Gastrointestinal: Negative.   Genitourinary: Negative.   Musculoskeletal: Negative.   Skin: Negative.   Neurological: Negative.   Endo/Heme/Allergies: Negative.   Psychiatric/Behavioral: Negative.      Family History  Problem Relation Age of Onset    Depression Mother    Anxiety disorder Mother    Hypertension Father    Depression Father    Anxiety disorder Father    Kidney disease Sister        born with end stage renal disease   Cancer Paternal Aunt        colon   Heart disease Maternal Grandmother    Cancer Maternal Grandmother        uterine   Heart disease Maternal Grandfather    Diabetes Paternal Grandmother    Diabetes Cousin    Breast cancer Neg Hx     Social History   Socioeconomic History   Marital status: Married    Spouse name: Not on file   Number of children: Not on file   Years of education: Not on file   Highest education level: Not on file  Occupational History   Not on file  Tobacco Use   Smoking status: Never   Smokeless tobacco: Never  Substance and Sexual Activity   Alcohol use: No   Drug use: No   Sexual activity: Yes  Other Topics Concern   Not on file  Social History Narrative   Not on file   Social Drivers of Health   Financial Resource Strain: Not on file  Food Insecurity: Low Risk  (04/30/2022)   Received from [facility]   Hunger Vital Sign    Within the past 12 months, you worried that your food would run out before you got money to buy more: Never true    Within the past 12 months, the food you bought just didn't last and you didn't have money to get more: Not on file  Transportation Needs: No Transportation Needs (04/30/2022)   Received from [facility] visits prior to 07/13/2022.   Transportation    In the past 12 months, has lack of reliable transportation kept you from medical appointments, meetings, work or from getting things needed for daily living?: No  Physical Activity: Not on file  Stress: Not on file  Social Connections: Not on file  Intimate Partner Violence: Low Risk  (04/30/2022)   Received from [facility] visits prior to 07/13/2022.   Safety    How often does anyone, including family and friends, physically hurt you?:  Never    How often does anyone, including family and friends, insult or talk down to you?: Never    How often does anyone, including family and friends, threaten you with harm?: Never    How often does anyone, including family and friends, scream or curse at you?: Never    Environmental and Social history  Lives in a house with a dry environment, no animals located inside the household, carpet in the bedroom, no plastic on the bed, no plastic on the pillow, and no smoking ongoing with inside household.  Objective:   Vitals:   12/11/23 0936  BP: 118/76  Pulse: 65  Resp: 16  SpO2: 99%   Height: 4' 11 (149.9 cm) Weight: 173 lb 12.8 oz (78.8 kg)  Physical Exam Constitutional:      Appearance: She is not diaphoretic.  HENT:     Head: Normocephalic.     Right Ear: Tympanic membrane, ear canal and external ear normal.     Left Ear: Tympanic membrane, ear canal and external ear normal.     Nose: Nose normal. No mucosal edema or rhinorrhea.     Mouth/Throat:     Pharynx: Uvula midline. No oropharyngeal exudate.  Eyes:     Conjunctiva/sclera: Conjunctivae normal.  Neck:     Thyroid : No thyromegaly.     Trachea: Trachea normal. No tracheal tenderness or tracheal deviation.  Cardiovascular:     Rate and Rhythm: Normal rate and regular rhythm.     Heart sounds: Normal heart sounds, S1 normal and S2 normal. No murmur heard. Pulmonary:     Effort: No respiratory distress.     Breath sounds: Normal breath sounds. No stridor. No wheezing or rales.  Lymphadenopathy:     Head:     Right side of head: No tonsillar adenopathy.     Left side of head: No tonsillar adenopathy.     Cervical: No cervical adenopathy.  Skin:    Findings: No erythema or rash.     Nails: There is no clubbing.  Neurological:     Mental Status: She is alert.     Diagnostics: Allergy skin tests were performed.   Spirometry was performed and demonstrated an FEV1 of 2.41 @ 97 % of predicted. FEV1/FVC =  0.82  Results of blood tests obtained 09 September 2023 identifies WBC 10.6, 2.2% eosinophils, 25% lymphocytes, hemoglobin 14.9, platelet 352, creatinine 0.7 mg/DL, AST 79L/O, ALT 86L/O, negative ANA, free T41.31 NG/DL, sed rate 30, C-reactive protein 8 mg/L, negative RF, negative IgE antibodies directed against multiple aeroallergens and against fruits, normal urine analysis   Assessment and Plan:    1. Pruritic dermatitis   2. Dermatographia   3. Not well controlled mild persistent asthma   4. Diarrhea, unspecified type   5. Inflammatory dermatosis     1. Allegra / fexofenadine 180 - 1-2 tablets 1-2 times per day (MAX=4/day)  2. Asmanex  220 twisthaler - 1 inhalation 1 time per day (empty lungs)  3. Airsupra  - 2 inhalations every 4-6 hours if needed (replaces albuterol)  4. Can continue topical clobetasol if needed  5. Review results of chest x-ray  6. Check diarrhea for Gastrointestinal Profile, Stool, PCR, Test Number [id number]  7. Check blood for tryptase, alpha-gal panel, thyroid  peroxidase antibody  8. 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  We will have her obtain some blood test and investigation of systemic disease contributing to this immune activation as noted above and for her diarrhea we will check for pathogens.  She appears to have developed recrudescence of her childhood asthma recently and we will start her on Asmanex  and I will review her chest x-ray.  She can use fexofenadine up to 4 times daily dose to address her pruritic disorder with dermatographia.  We may need to have her use omalizumab if she does not respond well to this treatment.  She can continue topical clobetasol for her inflammatory dermatosis as prescribed by dermatology.  I will see her back  in this clinic for skin testing.  Camellia DOROTHA Denis,  MD Allergy / Immunology [facility] [place] 

## 2023-12-15 ENCOUNTER — Encounter: Payer: Self-pay | Admitting: Allergy and Immunology

## 2023-12-16 LAB — THYROID PEROXIDASE ANTIBODY: Thyroperoxidase Ab SerPl-aCnc: 15 [IU]/mL (ref 0–34)

## 2023-12-16 LAB — ALPHA-GAL PANEL
Allergen Lamb IgE: <0.1 kU/L
Beef IgE: <0.1 kU/L
IgE (Immunoglobulin E), Serum: 23 [IU]/mL (ref 6–495)
O215-IgE Alpha-Gal: <0.1 kU/L
Pork IgE: <0.1 kU/L

## 2023-12-16 LAB — TRYPTASE: Tryptase: 7.7 ug/L (ref 2.2–13.2)

## 2023-12-18 ENCOUNTER — Ambulatory Visit (INDEPENDENT_AMBULATORY_CARE_PROVIDER_SITE_OTHER): Admitting: Allergy and Immunology

## 2023-12-18 DIAGNOSIS — L308 Other specified dermatitis: Secondary | ICD-10-CM | POA: Diagnosis not present

## 2023-12-18 DIAGNOSIS — L989 Disorder of the skin and subcutaneous tissue, unspecified: Secondary | ICD-10-CM

## 2023-12-22 ENCOUNTER — Encounter: Payer: Self-pay | Admitting: Allergy and Immunology

## 2023-12-22 ENCOUNTER — Ambulatory Visit: Payer: Self-pay | Admitting: Allergy and Immunology

## 2023-12-22 NOTE — Progress Notes (Signed)
 Diana Allen returns to this clinic for skin testing.  Allergy  skin testing did not identify any hypersensitivity against a screening panel of aeroallergens or foods.

## 2024-01-15 ENCOUNTER — Ambulatory Visit (INDEPENDENT_AMBULATORY_CARE_PROVIDER_SITE_OTHER): Admitting: Allergy and Immunology

## 2024-01-15 ENCOUNTER — Encounter: Payer: Self-pay | Admitting: Allergy and Immunology

## 2024-01-15 VITALS — BP 112/66 | HR 87 | Resp 16

## 2024-01-15 DIAGNOSIS — L989 Disorder of the skin and subcutaneous tissue, unspecified: Secondary | ICD-10-CM | POA: Diagnosis not present

## 2024-01-15 DIAGNOSIS — R197 Diarrhea, unspecified: Secondary | ICD-10-CM

## 2024-01-15 DIAGNOSIS — L308 Other specified dermatitis: Secondary | ICD-10-CM

## 2024-01-15 DIAGNOSIS — J4530 Mild persistent asthma, uncomplicated: Secondary | ICD-10-CM | POA: Diagnosis not present

## 2024-01-15 NOTE — Patient Instructions (Addendum)
  1. Allegra / fexofenadine 180 - 1-2 tablets 1-2 times per day (MAX=4/day)  2. Asmanex  220 twisthaler - 1 inhalation 1 time per day (empty lungs)  3. Airsupra  - 2 inhalations every 4-6 hours if needed   4. Can continue topical clobetasol if needed  5. Influenza = Tamiflu. Covid = Paxlovid  6. Plan for fall flu vaccine  7. Return to clinic December 2025 or earlier if problem. Taper medications???

## 2024-01-15 NOTE — Progress Notes (Signed)
 [place] - [place] - [place]   Follow-up Note  Referring Provider: Melba Lamarr BRAVO, NP Primary Provider: Melba Lamarr BRAVO, NP Date of Office Visit: 01/15/2024  Subjective:   Diana Allen (DOB: 11-Sep-1980) is a 43 y.o. female who [facility] on 01/15/2024 in re-evaluation of the following:  HPI: Diana Allen returns to this clinic in evaluation of pruritic disorder/inflammatory dermatosis, recrudescence of childhood asthma, diarrhea.  Her skin condition is under very good control while using fexofenadine just 1 time per day.    Her asthma has improved significantly and she no longer needs to use the short acting bronchodilator and she is exercising 3 times per week on the treadmill.  She continues on Asmanex  once a day.  Her prolonged episode of diarrhea has resolved.  Allergies as of 01/15/2024       Reactions   Nitrofurantoin Monohyd Macro Hives, Itching   Also, gives allergy  to strawberries   Amoxicillin  Rash        Medication List    Airsupra  90-80 MCG/ACT Aero Generic drug: Albuterol-Budesonide Inhale 1 Inhalation into the lungs every 6 (six) hours as needed.   albuterol 108 (90 Base) MCG/ACT inhaler Commonly known as: VENTOLIN HFA INHALE 2 PUFFS FOUR TIMES DAILY FOR WHEEZING   Asmanex  (30 Metered Doses) 220 MCG/ACT inhaler Generic drug: mometasone Inhale 1 puff into the lungs daily.   fexofenadine 180 MG tablet Commonly known as: ALLEGRA Take 180 mg by mouth.    Past Medical History:  Diagnosis Date   Asthma    Gestational diabetes    H/O varicella    Headache(784.0)    migraines   Personal history of kidney stones     Past Surgical History:  Procedure Laterality Date   CESAREAN SECTION  12/24/2011   Procedure: CESAREAN SECTION;  Surgeon: Duwaine Blumenthal, DO;  Location: [facility];  Service: Gynecology;  Laterality: N/A;   WISDOM TOOTH EXTRACTION      Review of systems negative except as  noted in HPI / PMHx or noted below:  Review of Systems  Constitutional: Negative.   HENT: Negative.    Eyes: Negative.   Respiratory: Negative.    Cardiovascular: Negative.   Gastrointestinal: Negative.   Genitourinary: Negative.   Musculoskeletal: Negative.   Skin: Negative.   Neurological: Negative.   Endo/Heme/Allergies: Negative.   Psychiatric/Behavioral: Negative.       Objective:   Vitals:   01/15/24 1155  BP: 112/66  Pulse: 87  Resp: 16  SpO2: 98%          Physical Exam Constitutional:      Appearance: She is not diaphoretic.  HENT:     Head: Normocephalic.     Right Ear: Tympanic membrane, ear canal and external ear normal.     Left Ear: Tympanic membrane, ear canal and external ear normal.     Nose: Nose normal. No mucosal edema or rhinorrhea.     Mouth/Throat:     Pharynx: Uvula midline. No oropharyngeal exudate.  Eyes:     Conjunctiva/sclera: Conjunctivae normal.  Neck:     Thyroid : No thyromegaly.     Trachea: Trachea normal. No tracheal tenderness or tracheal deviation.  Cardiovascular:     Rate and Rhythm: Normal rate and regular rhythm.     Heart sounds: Normal heart sounds, S1 normal and S2 normal. No murmur heard. Pulmonary:     Effort: No respiratory distress.     Breath sounds: Normal  breath sounds. No stridor. No wheezing or rales.  Lymphadenopathy:     Head:     Right side of head: No tonsillar adenopathy.     Left side of head: No tonsillar adenopathy.     Cervical: No cervical adenopathy.  Skin:    Findings: No erythema or rash.     Nails: There is no clubbing.  Neurological:     Mental Status: She is alert.     Diagnostics:    Spirometry was performed and demonstrated an FEV1 of 2.44 at 98 % of predicted.  Results of blood tests obtained 11 December 2023 identified negative alpha gal panel, tryptase 7.7 UG/L, thyroid  peroxidase antibody 15U/mL.  Assessment and Plan:   1. Asthma, well controlled, mild persistent   2. Pruritic  dermatitis   3. Inflammatory dermatosis   4. Diarrhea, unspecified type     1. Allegra / fexofenadine 180 - 1-2 tablets 1-2 times per day (MAX=4/day)  2. Asmanex  220 twisthaler - 1 inhalation 1 time per day (empty lungs)  3. Airsupra  - 2 inhalations every 4-6 hours if needed   4. Can continue topical clobetasol if needed  5. Influenza = Tamiflu. Covid = Paxlovid  6. Plan for fall flu vaccine  7. Return to clinic December 2025 or earlier if problem. Taper medications???  Diana Allen appears to be doing quite well.  She will remain on a antihistamine and a anti-inflammatory agent for her airway as noted above.  It is actually not very clear exactly why she has immune activation as all of her blood tests are normal and her previous skin test did not identify any atopic disease.  But she is certainly a lot better and everything seems to be moving in the right direction and I will see her back in this clinic in December 2025 or earlier if there is a problem and hopefully that point in time we can taper some of her medications.  Diana Denis, MD Allergy  / Immunology [facility]

## 2024-01-19 ENCOUNTER — Encounter: Payer: Self-pay | Admitting: Allergy and Immunology

## 2024-04-05 ENCOUNTER — Encounter: Payer: Self-pay | Admitting: Allergy and Immunology

## 2024-04-05 ENCOUNTER — Ambulatory Visit: Admitting: Allergy and Immunology

## 2024-04-05 VITALS — BP 112/82 | HR 64 | Resp 14

## 2024-04-05 DIAGNOSIS — L989 Disorder of the skin and subcutaneous tissue, unspecified: Secondary | ICD-10-CM

## 2024-04-05 DIAGNOSIS — J4530 Mild persistent asthma, uncomplicated: Secondary | ICD-10-CM | POA: Diagnosis not present

## 2024-04-05 DIAGNOSIS — L308 Other specified dermatitis: Secondary | ICD-10-CM

## 2024-04-05 NOTE — Progress Notes (Unsigned)
 [place] - [place]   Follow-up Note  Referring Provider: Melba Lamarr BRAVO, NP Primary Provider: Melba Lamarr BRAVO, NP Date of Office Visit: 04/05/2024  Subjective:   Diana Allen (DOB: 1981-02-10) is a 43 y.o. female who returns to the [facility] on 04/05/2024 in re-evaluation of the following:  HPI: Diana Allen returns to this clinic in evaluation of pruritic disorder, inflammatory dermatosis. I last saw her in this clinic for September 2025.  Her asthma is under excellent control and she has been using her Asmanex  every day.  Her requirement for short acting bronchodilators less than once a week.  She can exercise without any problem.  She has not required any systemic steroid to treat an asthma exacerbation.  She has very little problems with her pruritic disorder or skin inflammation.  She has been using her fexofenadine most days.  She has received this years flu vaccine.  Allergies as of 04/05/2024       Reactions   Nitrofurantoin Monohyd Macro Hives, Itching   Also, gives allergy  to strawberries   Amoxicillin  Rash        Medication List    Airsupra  90-80 MCG/ACT Aero Generic drug: Albuterol-Budesonide Inhale 1 Inhalation into the lungs every 6 (six) hours as needed.   Asmanex  (30 Metered Doses) 220 MCG/ACT inhaler Generic drug: mometasone Inhale 1 puff into the lungs daily.   fexofenadine 180 MG tablet Commonly known as: ALLEGRA Take 180 mg by mouth.    Past Medical History:  Diagnosis Date   Asthma    Gestational diabetes    H/O varicella    Headache(784.0)    migraines   Personal history of kidney stones     Past Surgical History:  Procedure Laterality Date   CESAREAN SECTION  12/24/2011   Procedure: CESAREAN SECTION;  Surgeon: Duwaine Blumenthal, DO;  Location: [facility];  Service: Gynecology;  Laterality: N/A;   WISDOM TOOTH EXTRACTION      Review of systems negative except as noted in HPI /  PMHx or noted below:  Review of Systems  Constitutional: Negative.   HENT: Negative.    Eyes: Negative.   Respiratory: Negative.    Cardiovascular: Negative.   Gastrointestinal: Negative.   Genitourinary: Negative.   Musculoskeletal: Negative.   Skin: Negative.   Neurological: Negative.   Endo/Heme/Allergies: Negative.   Psychiatric/Behavioral: Negative.       Objective:   Vitals:   04/05/24 1642  BP: (!) 144/88  Pulse: 64  Resp: 14  SpO2: 98%          Physical Exam Constitutional:      Appearance: She is not diaphoretic.  HENT:     Head: Normocephalic.     Right Ear: Tympanic membrane, ear canal and external ear normal.     Left Ear: Tympanic membrane, ear canal and external ear normal.     Nose: Nose normal. No mucosal edema or rhinorrhea.     Mouth/Throat:     Pharynx: Uvula midline. No oropharyngeal exudate.  Eyes:     Conjunctiva/sclera: Conjunctivae normal.  Neck:     Thyroid : No thyromegaly.     Trachea: Trachea normal. No tracheal tenderness or tracheal deviation.  Cardiovascular:     Rate and Rhythm: Normal rate and regular rhythm.     Heart sounds: Normal heart sounds, S1 normal and S2 normal. No murmur heard. Pulmonary:     Effort: No respiratory distress.     Breath sounds:  Normal breath sounds. No stridor. No wheezing or rales.  Lymphadenopathy:     Head:     Right side of head: No tonsillar adenopathy.     Left side of head: No tonsillar adenopathy.     Cervical: No cervical adenopathy.  Skin:    Findings: No erythema or rash.     Nails: There is no clubbing.  Neurological:     Mental Status: She is alert.     Diagnostics: Spirometry was performed and demonstrated an FEV1 of 2.33 at 94 % of predicted.  Assessment and Plan:   1. Asthma, well controlled, mild persistent   2. Pruritic dermatitis   3. Inflammatory dermatosis     1. Allegra / fexofenadine 180 - 1-2 tablets 1-2 times per day (MAX=4/day)  2. Asmanex  220 twisthaler - 1  inhalation 5-7 times per week (empty lungs)  3. Airsupra  - 2 inhalations every 4-6 hours if needed   4. Influenza = Tamiflu. Covid = Paxlovid  5. Return to clinic 6 months or earlier if problem. Taper medications???  Diana Allen appears to be doing quite well and I think there is an opportunity to consolidate some of her medical treatment and will have her decrease her inhaled steroid dose by about 20% aiming for Monday through Friday use.  Assuming she continues to do well with this plan I will see her back in this clinic in 6 months or earlier if there is a problem.  Diana Denis, MD Allergy  / Immunology [facility]

## 2024-04-05 NOTE — Patient Instructions (Addendum)
  1. Allegra / fexofenadine 180 - 1-2 tablets 1-2 times per day (MAX=4/day)  2. Asmanex  220 twisthaler - 1 inhalation 5-7 times per week (empty lungs)  3. Airsupra  - 2 inhalations every 4-6 hours if needed   4. Influenza = Tamiflu. Covid = Paxlovid  5. Return to clinic 6 months or earlier if problem. Taper medications???2.3394

## 2024-04-06 ENCOUNTER — Encounter: Payer: Self-pay | Admitting: Allergy and Immunology

## 2024-04-29 ENCOUNTER — Ambulatory Visit: Admitting: Allergy and Immunology
